# Patient Record
Sex: Male | Born: 1960 | Race: Black or African American | Hispanic: No | Marital: Married | Smoking: Never smoker
Health system: Southern US, Community
[De-identification: ages and names within clinical notes are randomized; demographics above are authoritative.]

## PROBLEM LIST (undated history)

## (undated) DIAGNOSIS — U071 COVID-19: Secondary | ICD-10-CM

## (undated) DIAGNOSIS — I4729 Other ventricular tachycardia: Secondary | ICD-10-CM

## (undated) DIAGNOSIS — M1990 Unspecified osteoarthritis, unspecified site: Secondary | ICD-10-CM

## (undated) DIAGNOSIS — D8685 Sarcoid myocarditis: Secondary | ICD-10-CM

## (undated) DIAGNOSIS — F32A Depression, unspecified: Secondary | ICD-10-CM

## (undated) DIAGNOSIS — I10 Essential (primary) hypertension: Secondary | ICD-10-CM

## (undated) DIAGNOSIS — I509 Heart failure, unspecified: Secondary | ICD-10-CM

## (undated) DIAGNOSIS — I472 Ventricular tachycardia: Secondary | ICD-10-CM

## (undated) HISTORY — PX: OTHER SURGICAL HISTORY: SHX169

## (undated) HISTORY — PX: COLONOSCOPY: SHX174

---

## 2015-09-08 DIAGNOSIS — Z95810 Presence of automatic (implantable) cardiac defibrillator: Secondary | ICD-10-CM

## 2015-09-08 HISTORY — DX: Presence of automatic (implantable) cardiac defibrillator: Z95.810

## 2017-04-30 DIAGNOSIS — I8390 Asymptomatic varicose veins of unspecified lower extremity: Secondary | ICD-10-CM | POA: Insufficient documentation

## 2017-05-03 DIAGNOSIS — I428 Other cardiomyopathies: Secondary | ICD-10-CM | POA: Insufficient documentation

## 2017-07-14 DIAGNOSIS — I5022 Chronic systolic (congestive) heart failure: Secondary | ICD-10-CM | POA: Insufficient documentation

## 2017-10-14 HISTORY — PX: INSERT / REPLACE / REMOVE PACEMAKER: SUR710

## 2017-10-21 DIAGNOSIS — D8685 Sarcoid myocarditis: Secondary | ICD-10-CM | POA: Insufficient documentation

## 2018-02-03 DIAGNOSIS — Z95810 Presence of automatic (implantable) cardiac defibrillator: Secondary | ICD-10-CM | POA: Insufficient documentation

## 2019-08-18 ENCOUNTER — Other Ambulatory Visit: Payer: Self-pay

## 2019-08-18 DIAGNOSIS — Z20822 Contact with and (suspected) exposure to covid-19: Secondary | ICD-10-CM

## 2019-08-19 LAB — NOVEL CORONAVIRUS, NAA: SARS-CoV-2, NAA: NOT DETECTED

## 2019-09-21 ENCOUNTER — Ambulatory Visit: Payer: No Typology Code available for payment source | Attending: Internal Medicine

## 2019-09-21 DIAGNOSIS — Z20822 Contact with and (suspected) exposure to covid-19: Secondary | ICD-10-CM

## 2019-09-22 LAB — NOVEL CORONAVIRUS, NAA: SARS-CoV-2, NAA: NOT DETECTED

## 2019-09-27 ENCOUNTER — Telehealth: Payer: Self-pay | Admitting: General Practice

## 2019-09-27 NOTE — Telephone Encounter (Signed)
Gave patient negative covid test results Patient understood 

## 2019-11-24 ENCOUNTER — Ambulatory Visit: Payer: 59 | Attending: Internal Medicine

## 2019-11-24 DIAGNOSIS — Z23 Encounter for immunization: Secondary | ICD-10-CM

## 2019-11-24 NOTE — Progress Notes (Signed)
   Covid-19 Vaccination Clinic  Name:  Tony Whitney    MRN: [id number] DOB: 1960-12-24  11/24/2019  [name] was observed post Covid-19 immunization for 15 minutes without incident. He was provided with Vaccine Information Sheet and instruction to access the V-Safe system.   [name] was instructed to call 911 with any severe reactions post vaccine: Marland Kitchen Difficulty breathing  . Swelling of face and throat  . A fast heartbeat  . A bad rash all over body  . Dizziness and weakness   Immunizations Administered    Name Date Dose VIS Date Route   Pfizer COVID-19 Vaccine 11/24/2019 10:05 AM 0.3 mL 08/18/2019 Intramuscular   Manufacturer: [employer], [employer]   Lot: [id number]   NDC: [address]-6

## 2019-12-19 ENCOUNTER — Ambulatory Visit: Payer: 59 | Attending: Internal Medicine

## 2019-12-19 DIAGNOSIS — Z23 Encounter for immunization: Secondary | ICD-10-CM

## 2019-12-19 NOTE — Progress Notes (Signed)
   Covid-19 Vaccination Clinic  Name:  Tony Whitney    MRN: [id number] DOB: 04/30/1961  12/19/2019  Tony Whitney was observed post Covid-19 immunization for 15 minutes without incident. He was provided with Vaccine Information Sheet and instruction to access the V-Safe system.   Tony Whitney was instructed to call 911 with any severe reactions post vaccine: Marland Kitchen Difficulty breathing  . Swelling of face and throat  . A fast heartbeat  . A bad rash all over body  . Dizziness and weakness   Immunizations Administered    Name Date Dose VIS Date Route   Pfizer COVID-19 Vaccine 12/19/2019 10:20 AM 0.3 mL 08/18/2019 Intramuscular   Manufacturer: [employer], [employer]   Lot: [id number]   NDC: [address]-2

## 2020-01-28 ENCOUNTER — Emergency Department
Admission: EM | Admit: 2020-01-28 | Discharge: 2020-01-28 | Disposition: A | Discharge status: Home / Self Care | Payer: 59 | Attending: Emergency Medicine | Admitting: Emergency Medicine

## 2020-01-28 ENCOUNTER — Other Ambulatory Visit: Payer: Self-pay

## 2020-01-28 ENCOUNTER — Encounter: Payer: Self-pay | Admitting: Emergency Medicine

## 2020-01-28 DIAGNOSIS — Z950 Presence of cardiac pacemaker: Secondary | ICD-10-CM | POA: Diagnosis not present

## 2020-01-28 DIAGNOSIS — I509 Heart failure, unspecified: Secondary | ICD-10-CM | POA: Diagnosis not present

## 2020-01-28 DIAGNOSIS — R42 Dizziness and giddiness: Secondary | ICD-10-CM | POA: Insufficient documentation

## 2020-01-28 DIAGNOSIS — R55 Syncope and collapse: Secondary | ICD-10-CM

## 2020-01-28 HISTORY — DX: Heart failure, unspecified: I50.9

## 2020-01-28 LAB — URINALYSIS, COMPLETE (UACMP) WITH MICROSCOPIC
Bacteria, UA: NONE SEEN
Bilirubin Urine: NEGATIVE
Glucose, UA: NEGATIVE mg/dL
Hgb urine dipstick: NEGATIVE
Ketones, ur: NEGATIVE mg/dL
Leukocytes,Ua: NEGATIVE
Nitrite: NEGATIVE
Protein, ur: NEGATIVE mg/dL
Specific Gravity, Urine: 1.004 — ABNORMAL LOW (ref 1.005–1.030)
Squamous Epithelial / HPF: NONE SEEN (ref 0–5)
pH: 5 (ref 5.0–8.0)

## 2020-01-28 LAB — BASIC METABOLIC PANEL
Anion gap: 6 (ref 5–15)
BUN: 15 mg/dL (ref 6–20)
CO2: 27 mmol/L (ref 22–32)
Calcium: 9.1 mg/dL (ref 8.9–10.3)
Chloride: 105 mmol/L (ref 98–111)
Creatinine, Ser: 1.33 mg/dL — ABNORMAL HIGH (ref 0.61–1.24)
GFR calc Af Amer: >60 mL/min (ref >60)
GFR calc non Af Amer: 58 mL/min — ABNORMAL LOW (ref >60)
Glucose, Bld: 120 mg/dL — ABNORMAL HIGH (ref 70–99)
Potassium: 4 mmol/L (ref 3.5–5.1)
Sodium: 138 mmol/L (ref 135–145)

## 2020-01-28 LAB — CBC
HCT: 51.6 % (ref 39.0–52.0)
Hemoglobin: 17 g/dL (ref 13.0–17.0)
MCH: 31.4 pg (ref 26.0–34.0)
MCHC: 32.9 g/dL (ref 30.0–36.0)
MCV: 95.4 fL (ref 80.0–100.0)
Platelets: 151 10*3/uL (ref 150–400)
RBC: 5.41 MIL/uL (ref 4.22–5.81)
RDW: 14.2 % (ref 11.5–15.5)
WBC: 5.1 10*3/uL (ref 4.0–10.5)
nRBC: 0 % (ref 0.0–0.2)

## 2020-01-28 LAB — GLUCOSE, CAPILLARY: Glucose-Capillary: 135 mg/dL — ABNORMAL HIGH (ref 70–99)

## 2020-01-28 MED ORDER — SODIUM CHLORIDE 0.9% FLUSH: 3.0000 mL | Freq: Once | INTRAVENOUS | Status: DC

## 2020-01-28 NOTE — ED Triage Notes (Signed)
Pt to ED via POV c/o syncopal episode about 30 minutes PTA. Pt states that he was standing in the doorway saying good-bye to his grandson and next thing he knew he was on the ground. Pt states that he hit his head on the TV. Pt states that he felt fine before episode. Pt reports not feeling well yesterday and says that he laid around the house. Pt denies N/V/D;. Pt states that he has been eating fine and drinking plenty of fluids. Pt denies problems with blood sugar. Pt reports 2 other episodes of syncope in the past but states that work up was negative. Pt is A & O at this time, in NAD.

## 2020-01-28 NOTE — ED Notes (Signed)
[employer] called and will fax over report per representative.

## 2020-01-28 NOTE — ED Provider Notes (Signed)
[facility] Emergency Department Provider Note  ____________________________________________  Time seen: Approximately 6:57 PM  I have reviewed the triage vital signs and the nursing notes.   HISTORY  Chief Complaint Loss of Consciousness    HPI Tony Whitney is a 59 y.o. male with a history of CHF with a pacemaker who comes the ED complaining of passing out episode.  He was in his usual state of health,  but about 1 PM today he suddenly passed out.  Denies any preceding headache vision change paresthesias weakness chest pain shortness of breath abdominal pain or back pain.  After passing out which lasted about a minute, he quickly returned to normal and remained asymptomatic.  Denies any recent palpitations, has been taking all medications as prescribed.  He takes Lasix every other day and today was the Lasix today.     Past Medical History:  Diagnosis Date  . CHF (congestive heart failure) (HCC)      There are no problems to display for this patient.    Past Surgical History:  Procedure Laterality Date  . pacemaker insertion       Prior to Admission medications   Not on File     Allergies Patient has no known allergies.   No family history on file.  Social History Social History   Tobacco Use  . Smoking status: Never Smoker  . Smokeless tobacco: Never Used  Substance Use Topics  . Alcohol use: Not Currently  . Drug use: Not Currently    Review of Systems  Constitutional:   No fever or chills.  ENT:   No sore throat. No rhinorrhea. Cardiovascular:   No chest pain positive syncope. Respiratory:   No dyspnea or cough. Gastrointestinal:   Negative for abdominal pain, vomiting and diarrhea.  Musculoskeletal:   Negative for focal pain or swelling All other systems reviewed and are negative except as documented above in ROS and HPI.  ____________________________________________   PHYSICAL EXAM:  VITAL SIGNS: ED Triage Vitals  [01/28/20 1350]  Enc Vitals Group     BP 97/70     Pulse Rate 70     Resp 16     Temp 98.2 F (36.8 C)     Temp Source Oral     SpO2 96 %     Weight 280 lb (127 kg)     Height 6' (1.829 m)     Head Circumference      Peak Flow      Pain Score 0     Pain Loc      Pain Edu?      Excl. in GC?     Vital signs reviewed, nursing assessments reviewed.   Constitutional:   Alert and oriented. Non-toxic appearance. Eyes:   Conjunctivae are normal. EOMI. PERRL. ENT      Head:   Normocephalic and atraumatic.      Nose:   Wearing a mask.      Mouth/Throat:   Wearing a mask.      Neck:   No meningismus. Full ROM. Hematological/Lymphatic/Immunilogical:   No cervical lymphadenopathy. Cardiovascular:   RRR. Symmetric bilateral radial and DP pulses.  No murmurs. Cap refill less than 2 seconds. Respiratory:   Normal respiratory effort without tachypnea/retractions. Breath sounds are clear and equal bilaterally. No wheezes/rales/rhonchi. Gastrointestinal:   Soft and nontender. Non distended. There is no CVA tenderness.  No rebound, rigidity, or guarding. Musculoskeletal:   Normal range of motion in all extremities. No joint effusions.  No lower extremity tenderness.  No edema. Neurologic:   Normal speech and language.  Motor grossly intact. No acute focal neurologic deficits are appreciated.  Skin:    Skin is warm, dry and intact. No rash noted.  No petechiae, purpura, or bullae.  ____________________________________________    LABS (pertinent positives/negatives) (all labs ordered are listed, but only abnormal results are displayed) Labs Reviewed  BASIC METABOLIC PANEL - Abnormal; Notable for the following components:      Result Value   Glucose, Bld 120 (*)    Creatinine, Ser 1.33 (*)    GFR calc non Af Amer 58 (*)    All other components within normal limits  URINALYSIS, COMPLETE (UACMP) WITH MICROSCOPIC - Abnormal; Notable for the following components:   Color, Urine COLORLESS  (*)    APPearance CLEAR (*)    Specific Gravity, Urine 1.004 (*)    All other components within normal limits  GLUCOSE, CAPILLARY - Abnormal; Notable for the following components:   Glucose-Capillary 135 (*)    All other components within normal limits  CBC  CBG MONITORING, ED   ____________________________________________   EKG  Interpreted by me Sinus rhythm rate of 79, left axis, normal intervals.  Normal QRS ST segments and T waves.  No ischemic changes.  ____________________________________________    RADIOLOGY  No results found.  ____________________________________________   PROCEDURES Procedures  ____________________________________________  DIFFERENTIAL DIAGNOSIS   Dysrhythmia, electrolyte abnormality, dehydration  CLINICAL IMPRESSION / ASSESSMENT AND PLAN / ED COURSE  Medications ordered in the ED: Medications - No data to display  Pertinent labs & imaging results that were available during my care of the patient were reviewed by me and considered in my medical decision making (see chart for details).  Tony Whitney was evaluated in Emergency Department on 01/28/2020 for the symptoms described in the history of present illness. He was evaluated in the context of the global COVID-19 pandemic, which necessitated consideration that the patient might be at risk for infection with the SARS-CoV-2 virus that causes COVID-19. Institutional protocols and algorithms that pertain to the evaluation of patients at risk for COVID-19 are in a state of rapid change based on information released by regulatory bodies including the CDC and federal and state organizations. These policies and algorithms were followed during the patient's care in the ED.   Patient presents with an episode of syncope without any prodromal symptoms, no red flags.  Completely asymptomatic before and after.  It is unusually hot today and today was his day for taking Lasix so I wonder if he was a bit  dehydrated and orthostatic.  He is feeling well, exam is benign, labs are unremarkable.  Pacemaker interrogated, report is showing normal pacemaker function without any dysrhythmia events recently.  Patient is eager to go home, will recommend follow-up with primary care.  Considering the patient's symptoms, medical history, and physical examination today, I have low suspicion for ACS, PE, TAD, pneumothorax, carditis, mediastinitis, pneumonia, CHF, or sepsis.       ____________________________________________   FINAL CLINICAL IMPRESSION(S) / ED DIAGNOSES    Final diagnoses:  Syncope, unspecified syncope type     ED Discharge Orders    None      Portions of this note were generated with dragon dictation software. Dictation errors may occur despite best attempts at proofreading.   Carrie Mew, MD 01/28/20 1904

## 2020-01-28 NOTE — ED Notes (Signed)
Patient and family updated. ICD pacemaker interrogated by this RN.

## 2020-01-28 NOTE — Discharge Instructions (Addendum)
Your lab tests and pacemaker check today were all okay. Please follow up with your doctor for continued monitoring of your symptoms.

## 2020-12-29 DIAGNOSIS — M1712 Unilateral primary osteoarthritis, left knee: Secondary | ICD-10-CM | POA: Insufficient documentation

## 2021-01-31 NOTE — Discharge Instructions (Signed)
Instructions after Total Knee Replacement   Felis Quillin P. Anne Boltz, Jr., M.D.     Dept. of Orthopaedics & Sports Medicine  [facility]  [address]  [place], [place]  [address]  Phone: [phone]   Fax: [phone]    DIET: Drink plenty of non-alcoholic fluids. Resume your normal diet. Include foods high in fiber.  ACTIVITY:  You may use crutches or a walker with weight-bearing as tolerated, unless instructed otherwise. You may be weaned off of the walker or crutches by your Physical Therapist.  Do NOT place pillows under the knee. Anything placed under the knee could limit your ability to straighten the knee.   Continue doing gentle exercises. Exercising will reduce the pain and swelling, increase motion, and prevent muscle weakness.   Please continue to use the TED compression stockings for 6 weeks. You may remove the stockings at night, but should reapply them in the morning. Do not drive or operate any equipment until instructed.  WOUND CARE:  Continue to use the PolarCare or ice packs periodically to reduce pain and swelling. You may bathe or shower after the staples are removed at the first office visit following surgery.  MEDICATIONS: You may resume your regular medications. Please take the pain medication as prescribed on the medication. Do not take pain medication on an empty stomach. You have been given a prescription for a blood thinner (Lovenox or Coumadin). Please take the medication as instructed. (NOTE: After completing a 2 week course of Lovenox, take one Enteric-coated aspirin once a day. This along with elevation will help reduce the possibility of phlebitis in your operated leg.) Do not drive or drink alcoholic beverages when taking pain medications.  CALL THE OFFICE FOR: Temperature above 101 degrees Excessive bleeding or drainage on the dressing. Excessive swelling, coldness, or paleness of the toes. Persistent nausea and vomiting.  FOLLOW-UP:  You  should have an appointment to return to the office in 10-14 days after surgery. Arrangements have been made for continuation of Physical Therapy (either home therapy or outpatient therapy).   [facility] Department Directory         [email or web address]       [email or web address]          Cardiology  Appointments: [place] - [phone] [place] - [phone]  Endocrinology  Appointments: [place] - [phone] [place] - [phone]  Gastroenterology  Appointments: [place] - [phone] [place] - [phone]        General Surgery   Appointments: [place] - [phone]  Internal Medicine/Family Medicine  Appointments: [place] - [phone] [place] - [phone] [place] - [phone]  Metabolic and Weigh Loss Surgery  Appointments: [place] - [phone]        Neurology  Appointments: [place] - [phone] [place] - [phone]  Neurosurgery  Appointments: [place] - [phone]  Obstetrics & Gynecology  Appointments: [place] - [phone] [place] - [phone]        Pediatrics  Appointments: [place] - [phone] [place] - [phone]  Physiatry  Appointments: [place] -[phone]  Physical Therapy  Appointments: [place] - [phone] [place] - [phone]        Podiatry  Appointments: [place] - [phone] [place] - [phone]  Pulmonology  Appointments: [place] - [phone]  Rheumatology  Appointments: [place] - [phone]        [place] Location: [facility]  [address] [place], [place]  [address]  [place] Location: [facility] [address] [place], [place]  [address]  [place] Location: [facility] [address] [place], [place]  [address]    

## 2021-03-03 ENCOUNTER — Other Ambulatory Visit: Payer: Self-pay

## 2021-03-03 ENCOUNTER — Other Ambulatory Visit
Admission: RE | Admit: 2021-03-03 | Discharge: 2021-03-03 | Disposition: A | Discharge status: Home / Self Care | Payer: No Typology Code available for payment source | Source: Ambulatory Visit | Attending: Orthopedic Surgery | Admitting: Orthopedic Surgery

## 2021-03-03 DIAGNOSIS — Z01810 Encounter for preprocedural cardiovascular examination: Secondary | ICD-10-CM | POA: Diagnosis not present

## 2021-03-03 DIAGNOSIS — Z01818 Encounter for other preprocedural examination: Secondary | ICD-10-CM | POA: Diagnosis not present

## 2021-03-03 HISTORY — DX: Essential (primary) hypertension: I10

## 2021-03-03 HISTORY — DX: Unspecified osteoarthritis, unspecified site: M19.90

## 2021-03-03 HISTORY — DX: Depression, unspecified: F32.A

## 2021-03-03 LAB — COMPREHENSIVE METABOLIC PANEL
ALT: 24 U/L (ref 0–44)
AST: 23 U/L (ref 15–41)
Albumin: 3.9 g/dL (ref 3.5–5.0)
Alkaline Phosphatase: 47 U/L (ref 38–126)
Anion gap: 6 (ref 5–15)
BUN: 12 mg/dL (ref 6–20)
CO2: 27 mmol/L (ref 22–32)
Calcium: 9.1 mg/dL (ref 8.9–10.3)
Chloride: 106 mmol/L (ref 98–111)
Creatinine, Ser: 1.43 mg/dL — ABNORMAL HIGH (ref 0.61–1.24)
GFR, Estimated: 56 mL/min — ABNORMAL LOW (ref >60)
Glucose, Bld: 108 mg/dL — ABNORMAL HIGH (ref 70–99)
Potassium: 4 mmol/L (ref 3.5–5.1)
Sodium: 139 mmol/L (ref 135–145)
Total Bilirubin: 1.1 mg/dL (ref 0.3–1.2)
Total Protein: 7.2 g/dL (ref 6.5–8.1)

## 2021-03-03 LAB — TYPE AND SCREEN
ABO/RH(D): O POS
Antibody Screen: NEGATIVE

## 2021-03-03 LAB — C-REACTIVE PROTEIN: CRP: 0.6 mg/dL (ref <1.0)

## 2021-03-03 LAB — CBC
HCT: 51.4 % (ref 39.0–52.0)
Hemoglobin: 17.2 g/dL — ABNORMAL HIGH (ref 13.0–17.0)
MCH: 30.9 pg (ref 26.0–34.0)
MCHC: 33.5 g/dL (ref 30.0–36.0)
MCV: 92.3 fL (ref 80.0–100.0)
Platelets: 144 10*3/uL — ABNORMAL LOW (ref 150–400)
RBC: 5.57 MIL/uL (ref 4.22–5.81)
RDW: 13.9 % (ref 11.5–15.5)
WBC: 3.1 10*3/uL — ABNORMAL LOW (ref 4.0–10.5)
nRBC: 0 % (ref 0.0–0.2)

## 2021-03-03 LAB — URINALYSIS, ROUTINE W REFLEX MICROSCOPIC
Bacteria, UA: NONE SEEN
Bilirubin Urine: NEGATIVE
Glucose, UA: >=500 mg/dL — AB
Hgb urine dipstick: NEGATIVE
Ketones, ur: NEGATIVE mg/dL
Leukocytes,Ua: NEGATIVE
Nitrite: NEGATIVE
Protein, ur: NEGATIVE mg/dL
Specific Gravity, Urine: 1.022 (ref 1.005–1.030)
pH: 5 (ref 5.0–8.0)

## 2021-03-03 LAB — PROTIME-INR
INR: 1.1 (ref 0.8–1.2)
Prothrombin Time: 14.7 seconds (ref 11.4–15.2)

## 2021-03-03 LAB — SURGICAL PCR SCREEN
MRSA, PCR: NEGATIVE
Staphylococcus aureus: NEGATIVE

## 2021-03-03 LAB — APTT: aPTT: 31 seconds (ref 24–36)

## 2021-03-03 LAB — SEDIMENTATION RATE: Sed Rate: 3 mm/hr (ref 0–20)

## 2021-03-03 MED ORDER — CELECOXIB 400 MG PO CAPS
400.0000 mg | ORAL_CAPSULE | Freq: Once | ORAL | Status: DC
  Filled 2021-03-03: qty 1

## 2021-03-03 NOTE — Patient Instructions (Signed)
INSTRUCTIONS FOR SURGERY     Your surgery is scheduled for:  Friday, July 8TH     To find out your arrival time for the day of surgery,          please call [phone] between 1 pm and 3 pm on :  Thursday, July 7TH     When you arrive for surgery, report to the REGISTRATION DESK ON THE FIRST FLOOR OF  THE MEDICAL MALL. ONCE THEY HAVE COMPLETED THEIR WORK, PLEASE GO TO THE SECOND FLOOR AND SIGN IN AT THE SURGERY DESK.    REMEMBER: Instructions that are not followed completely may result in serious medical risk,  up to and including death, or upon the discretion of your surgeon and anesthesiologist,            your surgery may need to be rescheduled.  __X__ 1. Do not eat food after midnight the night before your procedure.                    No gum, candy, lozenger, tic tacs, tums or hard candies.                  ABSOLUTELY NOTHING SOLID IN YOUR MOUTH AFTER MIDNIGHT                    You may drink unlimited clear liquids up to 2 hours before you are scheduled to arrive for surgery.                   Do not drink anything within those 2 hours unless you need to take medicine, then take the                   smallest amount you need.  Clear liquids include:  water, apple juice without pulp,                   any flavor Gatorade, Black coffee, black tea.  Sugar may be added but no dairy/ honey /lemon.                        Broth and jello is not considered a clear liquid.  __x__  2. On the morning of surgery, please brush your teeth with toothpaste and water. You may rinse with                  mouthwash if you wish but DO NOT SWALLOW TOOTHPASTE OR MOUTHWASH  __X___3. NO alcohol for 24 hours before or after surgery.  __x___ 4.  Do NOT smoke or use e-cigarettes for 24 HOURS PRIOR TO SURGERY.                      DO NOT  Use any chewable tobacco products for at least 6 hours prior to surgery.  __x___ 5. If you start any new  medication after this appointment and prior to surgery, please                   Bring it with you on the day of surgery.  ___x__ 6.  Notify your doctor if there is any change in your medical condition, such as fever,                   infection, vomitting, diarrhea or any open sores.  __x___ 7.  USE the CHG SOAP as instructed, the night before surgery and the day of surgery.                   Once you have washed with this soap, do NOT use any of the following: Powders, perfumes                    or lotions. Please do not wear make up, hairpins, clips or nail polish. You MAY wear deodorant.                                                          Men may shave their face and neck.                            DO NOT wear ANY jewelry on the day of surgery. If there are rings that are too tight to                    remove easily, please address this prior to the surgery day. Piercings need to be removed.                                                                     NO METAL ON YOUR BODY.                    Do NOT bring any valuables.  If you came to Pre-Admit testing then you will not need license,                     insurance card or credit card.  If you will be staying overnight, please either leave your things in                     the car or have your family be responsible for these items.                     [facility] IS NOT RESPONSIBLE FOR BELONGINGS OR VALUABLES.  ___X__ 8. DO NOT wear contact lenses on surgery day.  You may not have dentures,                     Hearing aides, contacts or glasses in the operating room. These items can be                    Placed in the Recovery Room to receive immediately after surgery.  ___x__ 10. Take the following medications on the morning of surgery with a sip of water:  1. METOPROLOL                     2. PREDNISONE, if you are still taking                     3.                     4.  __x___ 11.   Follow any instructions provided to you by your surgeon.                        Such as enema, clear liquid bowel prep                   COMPLETE THE PRESURGICAL CARBOHYDRATE DRINK ON THE                     MORNING OF SURGERY.  FINISH IT BY THE TIME YOU LEAVE YOUR                      HOUSE.   __X__  12. STOP ALL ASPIRIN PRODUCTS AS OF ONE WEEK PRIOR TO SURGERY                       THIS INCLUDES BC POWDERS / GOODIES POWDER  __x___ 13. STOP Anti-inflammatories as of ONE WEEK PRIOR TO SURGERY                      This includes IBUPROFEN / MOTRIN / ADVIL / ALEVE/ NAPROXYN                    YOU MAY TAKE TYLENOL ANY TIME PRIOR TO SURGERY.  ____X_ 14.  Stop supplements until after surgery.                     This includes:  FOLIC ACID   ______17.  Continue to take the following medications but do not take on the morning of surgery:                         LASIX // ENTRESTO (both dosages) // ALDACTONE  ____X__18. If staying overnight, please have appropriate shoes to wear to be able to walk around the unit.                   Wear clean and comfortable clothing to the hospital.  BRING CELL PHONE AND CHARGER.  HAVE STOOL SOFTENERS TO TAKE ONCE HOME.  I SUGGEST YOU START TAKING THEM A     COUPLE DAYS PRIOR TO SURGERY.  IF YOU COMPLETE THE [facility] [employer] [employer] BOOKLET, PLEASE BRING WITH YOU   SO WE CAN MAKE A COPY OF IT FOR YOUR CHART.

## 2021-03-04 ENCOUNTER — Encounter: Payer: Self-pay | Admitting: Orthopedic Surgery

## 2021-03-04 LAB — URINE CULTURE
Culture: NO GROWTH
Special Requests: NORMAL

## 2021-03-04 LAB — HEMOGLOBIN A1C
Hgb A1c MFr Bld: 5.6 % (ref 4.8–5.6)
Mean Plasma Glucose: 114 mg/dL

## 2021-03-04 NOTE — Progress Notes (Signed)
Perioperative Services  Pre-Admission/Anesthesia Testing Clinical Review  Date: 03/13/21  Patient Demographics:  Name: Tony Whitney DOB:   1961/04/11 MRN:   [id number]  Planned Surgical Procedure(s):    Case: [id number] Date/Time: 03/14/21 0700   Procedure: COMPUTER ASSISTED TOTAL KNEE ARTHROPLASTY - RNFA (Left: Knee)   Anesthesia type: Choice   Pre-op diagnosis: PRIMARY OSTEOARTHRITIS OF LEFT KNEE.   Location: [facility] OR ROOM 01 / [facility]S FOR ANESTHESIA [facility]   Surgeons: Tony Heinz, Whitney     NOTE: Available PAT nursing documentation and vital signs have been reviewed. Clinical nursing staff has updated patient's PMH/PSHx, current medication list, and drug allergies/intolerances to ensure comprehensive history available to assist in medical decision making as it pertains to the aforementioned surgical procedure and anticipated anesthetic course. Extensive review of available clinical information performed. Tony Whitney PMH and PSHx updated with any diagnoses/procedures that  may have been inadvertently omitted during his intake with the pre-admission testing department's nursing staff.  Clinical Discussion:  Tony Whitney is a 60 y.o. male who is submitted for pre-surgical anesthesia review and clearance prior to him undergoing the above procedure. Patient has never been a smoker. Pertinent PMH includes: cardiac sarcoidosis, dilated cardiomyopathy, CHF, NSVT, HTN, OA  Patient is followed by cardiology Tony Blow, Whitney). He was last seen in the cardiology clinic on 07/30/2020; notes reviewed.  At the time of his clinic visit, patient reported "quite well".  Patient denied any episodes of chest pain, shortness breath, PND, orthopnea, palpitations, significant peripheral edema, vertiginous symptoms, or presyncope/syncope.  Patient did report an episode a few weeks prior to being seen where he felt like he was "more bloated retaining fluid".  Patient use prescribed diuretic daily x 1 week and symptoms  resolved patient with concerns that activity level was not at the level that he would like, but noted that his exercise tolerance was much better than it had been in the past. Patient has a PMH significant for cardiovascular diagnoses.  TTE performed on 04/30/2017 revealed severe left ventricular systolic dysfunction (LVEF <15%), mild LVH, moderate right ventricular systolic dysfunction, biatrial enlargement, and mild valvular insufficiency.  Diagnostic left heart catheterization performed on 05/03/2017 demonstrated no significant CAD.  LVEDP elevated at 35-30 mmHg.  Cardiac MRI performed on 05/18/2017 demonstrated a severely depressed left ventricular systolic function; LVEF calculated at 17%.  Imaging noted a thickened lateral wall that was akinetic, in addition to severe hypokinesis of the remaining myocardial walls.  Right ventricle moderately dilated with normal thickness and severely depressed systolic function; RVEF 25%.  There was moderate biatrial enlargement.  Delayed enhancement imaging abnormal.  There was diffuse scar throughout the left ventricular myocardium and parts of the right ventricular myocardium.  Pattern felt to be nonspecific but may represent chronic severe myocarditis and an infiltrative disorder such as myocardial sarcoidosis.  Long-term cardiac event monitor study performed on 09/12/2017 demonstrated a predominantly underlying sinus rhythm with average heart rate of 71 bpm.  Minimum heart rate was 50 and maximum rate was 114 bpm.  There were rare PVCs (approximately 500) and one 8 beat run of NSVT.  Myocardial PET scan performed on 09/17/2017 revealed abnormal FDG uptake consistent with active inflammation of the myocardium/active sarcoid.  Repeat studies on 05/03/2019 and 12/12/2019 demonstrate no active inflammation.  Patient underwent implantation of a dual-chamber Medtronic AICD on 10/14/2017.  Blood pressure well controlled 121/81 on prescribed diuretic and  beta-blocker therapies.  Patient on a statin for his HLD.  Patient on chronic prednisone and MTX  for his cardiac sarcoidosis diagnosis. Functional capacity, as defined by DASI, is documented as being >/= 4 METS.  No changes were made to patient's medication regimen.  Patient to follow-up with outpatient cardiology in 9 months or sooner if needed.  Patient is scheduled for an elective total knee arthroplasty on 03/14/2021 with Dr. Francesco SorJames Whitney.  Given patient's past medical history significant for cardiovascular diagnoses, presurgical cardiac clearance was sought by the PAT team.  Per cardiology, "Tony Whitney has a cardiomyopathy with a reduced left ventricular ejection fraction related to sarcoidosis. However, he has responded to immunosuppression and medical treatment for his heart failure. His cardiac condition poses some risk for the surgery, but I am optimistic that this can be managed as his heart failure has been stable for some time, and his overall functional status is quite good. He will need to remain on immunosuppression around the time of surgery and this may increase his risk of a peri-operative infection.  Patient is optimized for surgery at an overall LOW risk for significant perioperative cardiovascular complications". This patient is not currently taking any type of anticoagulation/antiplatelet therapies that will need to be held preoperatively.  Patient denies previous perioperative complications with anesthesia in the past. In review of the EMR, there are no available records for review regarding patient's past surgical/anesthetic courses within the [facility] system.  Vitals with BMI 03/03/2021 01/28/2020 01/28/2020  Height 6\' 0"  - -  Weight 296 lbs 2 oz - -  BMI 40.15 - -  Systolic 103 - 103  Diastolic 78 - 80  Pulse 69 57 61    Providers/Specialists:   NOTE: Primary physician provider listed below. Patient may have been seen by APP or partner within same practice.   PROVIDER  ROLE / SPECIALTY LAST OV  Tony Whitney  Orthopedics (Surgeon) 12/26/2020  Tony Whitney  Primary Care Provider ???  Sherre Scarletevore, Tony D, Whitney  Cardiology 07/10/2020   Allergies:  Patient has no known allergies.  Current Home Medications:   No current facility-administered medications for this encounter.    folic acid (FOLVITE) 1 MG tablet   furosemide (LASIX) 20 MG tablet   methotrexate (RHEUMATREX) 2.5 MG tablet   metoprolol succinate (TOPROL-XL) 25 MG 24 hr tablet   predniSONE (DELTASONE) 5 MG tablet   sacubitril-valsartan (ENTRESTO) 24-26 MG   sacubitril-valsartan (ENTRESTO) 49-51 MG   spironolactone (ALDACTONE) 25 MG tablet   History:   Past Medical History:  Diagnosis Date   AICD (automatic cardioverter/defibrillator) present 2017   d/t chronic heart failure   Arthritis    Cardiac sarcoidosis    chronically takes prednisone and methotrexate   CHF (congestive heart failure) (HCC)    Depression    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 Social History   Tobacco Use   Smoking status: Never   Smokeless tobacco: Never  Vaping Use   Vaping Use: Never used  Substance Use Topics   Alcohol use: Not Currently   Drug use: Never    Pertinent Clinical Results:  LABS: Labs reviewed: Acceptable for surgery.  Hospital Outpatient Visit on 03/03/2021  Component Date Value Ref Range Status   CRP 03/03/2021 0.6  <1.0 mg/dL Final   Performed at [facility], [address] [place], [place] [address]   Hgb A1c MFr Bld 03/03/2021 5.6  4.8 - 5.6 % Final   Comment: (NOTE)         Prediabetes: 5.7 - 6.4         Diabetes: >6.4         Glycemic control for adults with diabetes: <7.0    Mean Plasma Glucose 03/03/2021 114  mg/dL Final    Comment: (NOTE) Performed At: [facility] [address] [place], [place] [id number] Jolene Schimke Whitney HT:[id number]    MRSA, PCR 03/03/2021 NEGATIVE  NEGATIVE Final   Staphylococcus aureus 03/03/2021 NEGATIVE  NEGATIVE Final   Comment: (NOTE) The Xpert SA Assay (FDA approved for NASAL specimens in patients 49 years of age and older), is one component of a comprehensive surveillance program. It is not intended to diagnose infection nor to guide or monitor treatment. Performed at [facility], [address] [place], [place] [address]    Sed Rate 03/03/2021 3  0 - 20 mm/hr Final   Performed at [facility], [address] [place], [place] [address]   WBC 03/03/2021 3.1 (A) 4.0 - 10.5 K/uL Final   RBC 03/03/2021 5.57  4.22 - 5.81 MIL/uL Final   Hemoglobin 03/03/2021 17.2 (A) 13.0 - 17.0 g/dL Final   HCT 86/16/8372 51.4  39.0 - 52.0 % Final   MCV 03/03/2021 92.3  80.0 - 100.0 fL Final   MCH 03/03/2021 30.9  26.0 - 34.0 pg Final   MCHC 03/03/2021 33.5  30.0 - 36.0 g/dL Final   RDW 90/21/1155 13.9  11.5 - 15.5 % Final   Platelets 03/03/2021 144 (A) 150 - 400 K/uL Final   nRBC 03/03/2021 0.0  0.0 - 0.2 % Final   Performed at [facility], [address] [place], [place] [address]   Sodium 03/03/2021 139  135 - 145 mmol/L Final   Potassium 03/03/2021 4.0  3.5 - 5.1 mmol/L Final   Chloride 03/03/2021 106  98 - 111 mmol/L Final   CO2 03/03/2021 27  22 - 32 mmol/L Final   Glucose, Bld 03/03/2021 108 (A) 70 - 99 mg/dL Final   Glucose reference range applies only to samples taken after fasting for at least 8 hours.   BUN 03/03/2021 12  6 - 20 mg/dL Final   Creatinine, Ser 03/03/2021 1.43 (A) 0.61 - 1.24 mg/dL Final   Calcium 23/36/1224 9.1  8.9 - 10.3 mg/dL Final   Total Protein 49/75/3005 7.2  6.5 - 8.1 g/dL Final   Albumin 07/09/1116 3.9  3.5 - 5.0 g/dL Final   AST 35/67/0141 23  15 - 41 U/L Final   ALT 03/03/2021 24  0 - 44 U/L Final    Alkaline Phosphatase 03/03/2021 47  38 - 126 U/L Final   Total Bilirubin 03/03/2021 1.1  0.3 - 1.2 mg/dL Final   GFR, Estimated 03/03/2021 56 (A) >60 mL/min Final   Comment: (NOTE) Calculated using the CKD-EPI Creatinine Equation (2021)    Anion gap 03/03/2021 6  5 - 15 Final   Performed at [facility], [address] [place], [place] [address]   Prothrombin Time 03/03/2021 14.7  11.4 - 15.2 seconds Final   INR 03/03/2021 1.1  0.8 - 1.2 Final   Comment: (NOTE) INR goal varies based on device and disease states. Performed at [facility], [address], [place], [place] [address]    aPTT 03/03/2021 31  24 -  36 seconds Final   Performed at [facility], [address] [place], [place] [address]   Color, Urine 03/03/2021 YELLOW (A) YELLOW Final   APPearance 03/03/2021 CLEAR (A) CLEAR Final   Specific Gravity, Urine 03/03/2021 1.022  1.005 - 1.030 Final   pH 03/03/2021 5.0  5.0 - 8.0 Final   Glucose, UA 03/03/2021 >=500 (A) NEGATIVE mg/dL Final   Hgb urine dipstick 03/03/2021 NEGATIVE  NEGATIVE Final   Bilirubin Urine 03/03/2021 NEGATIVE  NEGATIVE Final   Ketones, ur 03/03/2021 NEGATIVE  NEGATIVE mg/dL Final   Protein, ur 50/53/9767 NEGATIVE  NEGATIVE mg/dL Final   Nitrite 34/19/3790 NEGATIVE  NEGATIVE Final   Leukocytes,Ua 03/03/2021 NEGATIVE  NEGATIVE Final   RBC / HPF 03/03/2021 0-5  0 - 5 RBC/hpf Final   WBC, UA 03/03/2021 0-5  0 - 5 WBC/hpf Final   Bacteria, UA 03/03/2021 NONE SEEN  NONE SEEN Final   Squamous Epithelial / LPF 03/03/2021 0-5  0 - 5 Final   Mucus 03/03/2021 PRESENT   Final   Performed at [facility], [address] [place], [place] [address]   Specimen Description 03/03/2021    Final                   Value:URINE, RANDOM Performed at [facility], [address] [place], [place] [address]    Special Requests 03/03/2021    Final                   Value:Normal Performed at [facility], [address], [place], [place] [address]    Culture 03/03/2021    Final                   Value:NO GROWTH Performed at [facility], [address], [place], [place] [address]    Report Status 03/03/2021 03/04/2021 FINAL   Final   ABO/RH(D) 03/03/2021 O POS   Final   Antibody Screen 03/03/2021 NEG   Final   Sample Expiration 03/03/2021 03/17/2021,2359   Final   Extend sample reason 03/03/2021    Final                   Value:NO TRANSFUSIONS OR PREGNANCY IN THE PAST 3 MONTHS Performed at [facility], [address] [place], [place] [address]     ECG: Date: 03/03/2021 Time ECG obtained: 1048 AM Rate: 65 bpm Rhythm:  Atrial paced rhythm Axis (leads I and aVF): Normal Intervals: PR 194 ms. QRS 104 ms. QTc 430 ms. ST segment and T wave changes: Inferior nonspecific T wave abnormality; T wave inversion in lateral leads Comparison: Similar to previous tracing obtained on 11/29/2019 No previous tracings available for review and comparison.   IMAGING / PROCEDURES: PET MYOCARDIAL WITH CONCURRENT CT performed on 12/12/2019 No FDG uptake within the myocardium to indicate active inflammation Nonspecific perfusion defects likely representing chronic sarcoidosis/scarring Previously noted extensive hypermetabolism no longer identified In the chest, there has been near resolution of previous FDG avid right lower lobe opacities with small ill-defined opacity remaining with SUV max of 3.7 (previously SUV max 11). Several new FDG avid opacities in the left lower lobe, likely inflammatory or infectious No metabolically active mediastinal or axillary lymph nodes No FDG avid osseous lesions Left chest wall pacemaker Probable right renal cyst again noted Visualized upper abdominal viscera unremarkable No evidence of active myocardial inflammation  ECHOCARDIOGRAM WITH BUBBLE STUDY performed on 12/11/2019 LVEF 25% Severe left  ventricular dysfunction Elevated left atrial pressures  with diastolic dysfunction Mild right ventricular systolic dysfunction Trivial MR and TR No AR or PR No valvular stenosis Saline microcavitation showed borderline late atrial shunting after Valsalva; positive saline contrast study  LONG TERM CARDIAC EVENT MONITOR STUDY performed in 09/12/2017 Predominantly underlying sinus rhythm with average heart rate of 71 bpm Minimum heart rate was 50 bpm Maximum sinus heart rate was 114 bpm Rare PVCs (approximately 500) with one 8 beat run of NSVT No significant pauses greater than 2 seconds or significant AV block  CARDIAC MRI performed on 05/18/2017 The left ventricle is severely dilated and spherical in appearance. There is severely depressed systolic function with a calculated ejection fraction of 17%. The lateral wall is thinned out and akinetic. The rest of the walls are severely hypokinetic.  The right ventricle is moderately dilated with normal thickness and with severely depressed systolic function. RVEF is 25%.  The atria are moderately enlarged.  The aortic valve is trileaflet in morphology. There is no significant aortic valve stenosis or regurgitation. There is mild tricuspid and mild-moderate mitral valve regurgitation.  Delayed enhancement imaging is abnormal. There is diffuse scar throughout the LV myocardium and parts of the RV myocardium. The scar is in the midwall in the septum and is nearly transmural in the lateral wall. The pattern is nonspecific but may represent chronic severe myocarditis and an infiltrative disorder such as myocardial sarcoidosis cannot be ruled out.  No intracardiac thrombus are visualized. There is a small pericardial effusion  LEFT HEART CATHETERIZATION AND CORONARY ANGIOGRAPHY performed on 05/03/2017 No significant CAD LVEDP elevated at 30-35 mmHg  TRANSTHORACIC ECHOCARDIOGRAM performed on 04/30/2017 LVEF <15% Severe left ventricular systolic dysfunction with mild LVH Moderate right ventricular systolic  dysfunction Mild MR Trivial TR and PR No AR No evidence of valvular stenosis Trivial pericardial effusion Biatrial enlargement Severe diastolic dysfunction No previous studies for comparison  Impression and Plan:  Zahi Plaskett has been referred for pre-anesthesia review and clearance prior to him undergoing the planned anesthetic and procedural courses. Available labs, pertinent testing, and imaging results were personally reviewed by me. This patient has been appropriately cleared by cardiology with an overall LOW risk of significant perioperative cardiovascular complications. Completed perioperative prescription for cardiac device management documentation completed by primary cardiology team and placed on patient's chart for review by the surgical/anesthetic team on the day of his procedure.   Based on clinical review performed today (03/12/2021), barring any significant acute changes in the patient's overall condition, it is anticipated that he will be able to proceed with the planned surgical intervention. Any acute changes in clinical condition may necessitate his procedure being postponed and/or cancelled. Patient will meet with anesthesia team (Whitney and/or CRNA) on the day of his procedure for preoperative evaluation/assessment. Questions regarding anesthetic course will be fielded at that time.   Pre-surgical instructions were reviewed with the patient during his PAT appointment and questions were fielded by PAT clinical staff. Patient was advised that if any questions or concerns arise prior to his procedure then he should return a call to PAT and/or his surgeon's office to discuss.  Quentin Mulling, MSN, APRN, FNP-C, CEN [facility]  Peri-operative Services Nurse Practitioner Phone: [phone] Fax: [phone] 03/12/2021 8:48 AM  NOTE: This note has been prepared using Dragon dictation software. Despite my best ability to proofread, there is always the potential  that unintentional transcriptional errors may still occur from this process.

## 2021-03-12 ENCOUNTER — Other Ambulatory Visit: Payer: Self-pay

## 2021-03-12 ENCOUNTER — Other Ambulatory Visit
Admission: RE | Admit: 2021-03-12 | Discharge: 2021-03-12 | Disposition: A | Discharge status: Home / Self Care | Payer: No Typology Code available for payment source | Source: Ambulatory Visit | Attending: Orthopedic Surgery | Admitting: Orthopedic Surgery

## 2021-03-12 DIAGNOSIS — Z01812 Encounter for preprocedural laboratory examination: Secondary | ICD-10-CM | POA: Insufficient documentation

## 2021-03-12 DIAGNOSIS — U071 COVID-19: Secondary | ICD-10-CM | POA: Insufficient documentation

## 2021-03-12 LAB — SARS CORONAVIRUS 2 (TAT 6-24 HRS): SARS Coronavirus 2: POSITIVE — AB

## 2021-05-22 ENCOUNTER — Other Ambulatory Visit: Payer: Self-pay

## 2021-05-22 ENCOUNTER — Encounter
Admission: RE | Admit: 2021-05-22 | Discharge: 2021-05-22 | Disposition: A | Discharge status: Home / Self Care | Payer: No Typology Code available for payment source | Source: Ambulatory Visit | Attending: Orthopedic Surgery | Admitting: Orthopedic Surgery

## 2021-05-22 DIAGNOSIS — Z01812 Encounter for preprocedural laboratory examination: Secondary | ICD-10-CM | POA: Diagnosis not present

## 2021-05-22 DIAGNOSIS — R300 Dysuria: Secondary | ICD-10-CM | POA: Diagnosis not present

## 2021-05-22 HISTORY — DX: COVID-19: U07.1

## 2021-05-22 LAB — COMPREHENSIVE METABOLIC PANEL
ALT: 23 U/L (ref 0–44)
AST: 24 U/L (ref 15–41)
Albumin: 3.9 g/dL (ref 3.5–5.0)
Alkaline Phosphatase: 41 U/L (ref 38–126)
Anion gap: 6 (ref 5–15)
BUN: 18 mg/dL (ref 6–20)
CO2: 25 mmol/L (ref 22–32)
Calcium: 9.3 mg/dL (ref 8.9–10.3)
Chloride: 106 mmol/L (ref 98–111)
Creatinine, Ser: 1.33 mg/dL — ABNORMAL HIGH (ref 0.61–1.24)
GFR, Estimated: >60 mL/min (ref >60)
Glucose, Bld: 111 mg/dL — ABNORMAL HIGH (ref 70–99)
Potassium: 3.9 mmol/L (ref 3.5–5.1)
Sodium: 137 mmol/L (ref 135–145)
Total Bilirubin: 1.2 mg/dL (ref 0.3–1.2)
Total Protein: 7.1 g/dL (ref 6.5–8.1)

## 2021-05-22 LAB — CBC
HCT: 49.5 % (ref 39.0–52.0)
Hemoglobin: 17.1 g/dL — ABNORMAL HIGH (ref 13.0–17.0)
MCH: 31.8 pg (ref 26.0–34.0)
MCHC: 34.5 g/dL (ref 30.0–36.0)
MCV: 92 fL (ref 80.0–100.0)
Platelets: 166 10*3/uL (ref 150–400)
RBC: 5.38 MIL/uL (ref 4.22–5.81)
RDW: 14.1 % (ref 11.5–15.5)
WBC: 4.4 10*3/uL (ref 4.0–10.5)
nRBC: 0 % (ref 0.0–0.2)

## 2021-05-22 LAB — URINALYSIS, COMPLETE (UACMP) WITH MICROSCOPIC
Bacteria, UA: NONE SEEN
Bilirubin Urine: NEGATIVE
Glucose, UA: >=500 mg/dL — AB
Hgb urine dipstick: NEGATIVE
Ketones, ur: NEGATIVE mg/dL
Leukocytes,Ua: NEGATIVE
Nitrite: NEGATIVE
Protein, ur: NEGATIVE mg/dL
Specific Gravity, Urine: 1.023 (ref 1.005–1.030)
Squamous Epithelial / HPF: NONE SEEN (ref 0–5)
pH: 5 (ref 5.0–8.0)

## 2021-05-22 LAB — PROTIME-INR
INR: 1.1 (ref 0.8–1.2)
Prothrombin Time: 14.5 seconds (ref 11.4–15.2)

## 2021-05-22 LAB — TYPE AND SCREEN
ABO/RH(D): O POS
Antibody Screen: NEGATIVE

## 2021-05-22 LAB — C-REACTIVE PROTEIN: CRP: 0.6 mg/dL (ref <1.0)

## 2021-05-22 LAB — SEDIMENTATION RATE: Sed Rate: 2 mm/hr (ref 0–20)

## 2021-05-22 LAB — SURGICAL PCR SCREEN
MRSA, PCR: NEGATIVE
Staphylococcus aureus: NEGATIVE

## 2021-05-22 LAB — APTT: aPTT: 30 seconds (ref 24–36)

## 2021-05-22 NOTE — Patient Instructions (Signed)
Your procedure is scheduled on: 06/02/21  Report to the Registration Desk on the 1st floor of the Medical Mall. To find out your arrival time, please call [phone] between 1PM - 3PM on: 05/30/21 - Friday  REMEMBER: Instructions that are not followed completely may result in serious medical risk, up to and including death; or upon the discretion of your surgeon and anesthesiologist your surgery may need to be rescheduled.  Do not eat food after midnight the night before surgery.  No gum chewing, lozengers or hard candies.  You may however, drink CLEAR liquids up to 2 hours before you are scheduled to arrive for your surgery. Do not drink anything within 2 hours of your scheduled arrival time.  Clear liquids include: - water  - apple juice without pulp - gatorade (not RED, PURPLE, OR BLUE) - black coffee or tea (Do NOT add milk or creamers to the coffee or tea) Do NOT drink anything that is not on this list.  In addition, your doctor has ordered for you to drink the provided  Ensure Pre-Surgery Clear Carbohydrate Drink  Drinking this carbohydrate drink up to two hours before surgery helps to reduce insulin resistance and improve patient outcomes. Please complete drinking 2 hours prior to scheduled arrival time.  TAKE THESE MEDICATIONS THE MORNING OF SURGERY WITH A SIP OF WATER: - metoprolol succinate (TOPROL-XL) 25 MG 24 hr tablet - predniSONE (DELTASONE) 5 MG tablet  One week prior to surgery: Stop Anti-inflammatories (NSAIDS) such as Advil, Aleve, Ibuprofen, Motrin, Naproxen, Naprosyn and Aspirin based products such as Excedrin, Goodys Powder, BC Powder.  Stop ANY OVER THE COUNTER supplements until after surgery.  You may however, continue to take Tylenol if needed for pain up until the day of surgery.  No Alcohol for 24 hours before or after surgery.  No Smoking including e-cigarettes for 24 hours prior to surgery.  No chewable tobacco products for at least 6 hours prior  to surgery.  No nicotine patches on the day of surgery.  Do not use any "recreational" drugs for at least a week prior to your surgery.  Please be advised that the combination of cocaine and anesthesia may have negative outcomes, up to and including death. If you test positive for cocaine, your surgery will be cancelled.  On the morning of surgery brush your teeth with toothpaste and water, you may rinse your mouth with mouthwash if you wish. Do not swallow any toothpaste or mouthwash.  Use CHG Soap or wipes as directed on instruction sheet.  Do not wear jewelry, make-up, hairpins, clips or nail polish.  Do not wear lotions, powders, or perfumes.   Do not shave body from the neck down 48 hours prior to surgery just in case you cut yourself which could leave a site for infection.  Also, freshly shaved skin may become irritated if using the CHG soap.  Contact lenses, hearing aids and dentures may not be worn into surgery.  Do not bring valuables to the hospital. [facility] is not responsible for any missing/lost belongings or valuables.   Notify your doctor if there is any change in your medical condition (cold, fever, infection).  Wear comfortable clothing (specific to your surgery type) to the hospital.  After surgery, you can help prevent lung complications by doing breathing exercises.  Take deep breaths and cough every 1-2 hours. Your doctor may order a device called an Incentive Spirometer to help you take deep breaths. When coughing or sneezing, hold a pillow  firmly against your incision with both hands. This is called "splinting." Doing this helps protect your incision. It also decreases belly discomfort.  If you are being admitted to the hospital overnight, leave your suitcase in the car. After surgery it may be brought to your room.  If you are being discharged the day of surgery, you will not be allowed to drive home. You will need a responsible adult (18 years or older)  to drive you home and stay with you that night.   If you are taking public transportation, you will need to have a responsible adult (18 years or older) with you. Please confirm with your physician that it is acceptable to use public transportation.   Please call the Pre-admissions Testing Dept. at [phone] if you have any questions about these instructions.  Surgery Visitation Policy:  Patients undergoing a surgery or procedure may have one family member or support person with them as long as that person is not COVID-19 positive or experiencing its symptoms.  That person may remain in the waiting area during the procedure.  Inpatient Visitation:    Visiting hours are 7 a.m. to 8 p.m. Inpatients will be allowed two visitors daily. The visitors may change each day during the patient's stay. No visitors under the age of 55. Any visitor under the age of 28 must be accompanied by an adult. The visitor must pass COVID-19 screenings, use hand sanitizer when entering and exiting the patient's room and wear a mask at all times, including in the patient's room. Patients must also wear a mask when staff or their visitor are in the room. Masking is required regardless of vaccination status.

## 2021-05-23 LAB — URINE CULTURE: Culture: NO GROWTH

## 2021-05-31 NOTE — Anesthesia Preprocedure Evaluation (Addendum)
Anesthesia Evaluation  Patient identified by MRN, date of birth, ID band Patient awake    Reviewed: Allergy & Precautions, NPO status , Patient's Chart, lab work & pertinent test results  Airway Mallampati: III  TM Distance: >3 FB Neck ROM: Full    Dental  (+) Missing,    Pulmonary neg pulmonary ROS,    Pulmonary exam normal        Cardiovascular Exercise Tolerance: Poor METS: 3 - Mets hypertension, + CAD (nonobstructing) and +CHF  (-) DOE negative cardio ROS Normal cardiovascular exam+ dysrhythmias (NSVT (nonsustained ventricular tachycardia) ) + Cardiac Defibrillator (placed 2019)  Rhythm:Regular Rate:Normal - Systolic murmurs and - Peripheral Edema NICM, EF 28%, non-obstructive CAD by cath in 2018, S/P ICD, diagnosed with sarcoidosis,  started on Methotrexate and Prednisone in 2019, has since discontinued Prednisone   Cardiac sarcoidosis  Device Interrogation 04/04/2021:  Stable dual chamber ICD battery and lead measurements. <1% RV Paced. No therapies delivered. 5 VT-NS episodes. Longest- 1 second. EGMs suggest non-sustained VT.   NYHA class II   Neuro/Psych PSYCHIATRIC DISORDERS Depression negative neurological ROS  negative psych ROS   GI/Hepatic negative GI ROS, Neg liver ROS,   Endo/Other  negative endocrine ROS  Renal/GU negative Renal ROS  negative genitourinary   Musculoskeletal negative musculoskeletal ROS (+) Arthritis , Osteoarthritis,    Abdominal (+) + obese,   Peds negative pediatric ROS (+)  Hematology negative hematology ROS (+)   Anesthesia Other Findings PET Myocardial Perfusion 05/2021: Metabolism:  Cardiac PET/CT metabolic study with F-18 FDG 14.6 mCi injected IV andscanned on PET Discovery MI. There are several  segments with hypermetabolic activity ranging from mild-severe suggesting an underlying active inflammatory process in the  LV myocardium. This is a change from the last  FDG cardiac PET performed in 12/2019 which demonstrated no evidence of  hypermetabolic activity. There is no evidence of abnormal metabolism in the RV.   Perfusion/Function:  Gated cardiac PET/CT rest myocardial perfusion study with [id number] demonstrates:  1. Myocardial perfusion defects are present on rest images. Depending upon whether or not there is concomitant    hypermetabolic acitivty in each LV segment with a perfusion abnormality, some of these segments are consistent with    active inflammation while others are scar.  2. Severely reduced left ventricular systolic function with increased left ventricular volumes.   Cardiac CT Viewer:  Coronary artery calcifications are present. Dual-chamber ICD noted.     ECHO 12/2019: SEVERE LV DYSFUNCTION (See above)  ELEVATED LA PRESSURES WITH DIASTOLIC DYSFUNCTION  MILD RV SYSTOLIC DYSFUNCTION (See above)  VALVULAR REGURGITATION: TRIVIAL MR, TRIVIAL TR  NO VALVULAR STENOSIS  SALINE MICROCAVITATIONS SHOW BORDERLINE LATE ATRIAL SHUNTING AFTER  VALSALVA.  POSITIVE SALINE CONTRAST STUDY  3D acquisition and reconstructions were performed as part of this  examination to more accurately quantify the effects of reduced left  ventricular ejection fraction. (post-processing on an Independent  workstation).    Reproductive/Obstetrics negative OB ROS                         Anesthesia Physical Anesthesia Plan  ASA: 4  Anesthesia Plan: General   Post-op Pain Management:    Induction:   PONV Risk Score and Plan: 1 and 2 and Ondansetron, Dexamethasone and Midazolam  Airway Management Planned: Oral ETT  Additional Equipment: Arterial line  Intra-op Plan:   Post-operative Plan: Extubation in OR  Informed Consent: I have reviewed the patients History and Physical, chart, labs and  discussed the procedure including the risks, benefits and alternatives for the proposed anesthesia with  the patient or authorized representative who has indicated his/her understanding and acceptance.     Dental advisory given  Plan Discussed with: Anesthesiologist, CRNA and Surgeon  Anesthesia Plan Comments:       Anesthesia Quick Evaluation

## 2021-06-01 NOTE — H&P (Signed)
ORTHOPAEDIC HISTORY & PHYSICAL Michelene Gardener, [place] - 05/27/2021 8:45 AM EDT Formatting of this note is different from the original. [facility] Chief Complaint:   Chief Complaint  Patient presents with   Knee Pain  H & P LEFT KNEE   History of Present Illness:   Tony Whitney is a 60 y.o. male that presents to clinic today for his preoperative history and evaluation. Patient presents unaccompanied. The patient is scheduled to undergo a left total knee arthroplasty on 06/02/21 by Dr. Ernest Pine. His pain began several years ago. The pain is located primarily along the medial aspect of the knee. He describes his pain as worse with walking and stairs. He reports associated giving way of the knee. He denies associated numbness or tingling, locking, or swelling.    The patient's symptoms have progressed to the point that they decrease his quality of life. The patient has previously undergone conservative treatment including NSAIDS and injections to the knee without adequate control of his symptoms.  Patient sees Dr Edwena Blow for heart failure. He has received cardiac clearance. Denies history of lumbar surgery, blood clots.   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  LVEF 25% by PET 6/19   Idiopathic cardiomyopathy (CMS-HCC)   NSVT (nonsustained ventricular tachycardia) (CMS-HCC)   Past Surgical History:  Past Surgical History:  Procedure Laterality Date   icd   KNEE ARTHROSCOPY   RECONSTRUCTION WRIST   Current Medications:  Current Outpatient Medications  Medication Sig Dispense Refill   empagliflozin (JARDIANCE) 10 mg tablet Take 1 tablet (10 mg total) by mouth daily with breakfast 90 tablet 3   escitalopram oxalate (LEXAPRO)  20 MG tablet Take 20 mg by mouth once daily   folic acid (FOLVITE) 1 MG tablet Take 1 tablet (1 mg total) by mouth once daily 90 tablet 3   FUROsemide (LASIX) 20 MG tablet Take 1 tablet (20 mg total) by mouth every other day (Patient taking differently: Take 20 mg by mouth every other day as needed) 23 tablet 6   meloxicam (MOBIC) 7.5 MG tablet Take 7.5 mg by mouth once daily   metoprolol succinate (TOPROL-XL) 25 MG XL tablet TAKE 1 TABLET BY MOUTH EVERY DAY 90 tablet 3   sacubitriL-valsartan (ENTRESTO) 24-26 mg tablet Take 1 tablet by mouth 2 (two) times daily   spironolactone (ALDACTONE) 25 MG tablet Take 0.5 tablets (12.5 mg total) by mouth once daily 45 tablet 3   methotrexate (RHEUMATREX) 2.5 MG tablet 2.5mg  every Saturday and Sunday (Patient taking differently: Take 2.5 mg by mouth every Saturday and Sunday 2.5mg  every Saturday and Sunday) 24 tablet 3   No current facility-administered medications for this visit.   Allergies:  Allergies  Allergen Reactions   Dotarem [Gadoterate Meglumine] Vomiting   Social History:  Social History   Socioeconomic History   Marital status: Married  Spouse name: Leta Jungling   Number of children: 3   Years of education: 12   Highest education level: High school graduate  Occupational History   Occupation: Disabled-Maintenance  Tobacco Use   Smoking status: Never Smoker   Smokeless tobacco: Never Used  Building services engineer Use: Never used  Substance and Sexual Activity   Alcohol use: Never   Drug use: No   Sexual activity:  Defer  Social History Narrative  ** Merged History Encounter **    Family History:  Family History  Problem Relation Age of Onset   High blood pressure (Hypertension) Mother  died 51 unknown cause   Diabetes type II Mother   Stroke Father   Review of Systems:   A 10+ ROS was performed, reviewed, and the pertinent orthopaedic findings are documented in the HPI.   Physical Examination:   BP (!) 140/80 (BP Location:  Left upper arm, Patient Position: Sitting, BP Cuff Size: Large Adult)  Ht 182.9 cm (6')  Wt (!) 132.4 kg (291 lb 12.8 oz)  BMI 39.58 kg/m   Patient is a well-developed, well-nourished male in no acute distress. Patient has normal mood and affect. Patient is alert and oriented to person, place, and time.   HEENT: Atraumatic, normocephalic. Pupils equal and reactive to light. Extraocular motion intact. Noninjected sclera.  Cardiovascular: Regular rate and rhythm, with no murmurs, rubs, or gallops. Distal pulses palpable. No carotid bruits.  Respiratory: Lungs clear to auscultation bilaterally.   Left  Knee:    Soft tissue swelling: none    Effusion:                   none    Erythema:                 none    Crepitance:               mild    Tenderness:             medial    Alignment:                relative varus    Mediolateral laxity:   medial pseudolaxity    Posterior sag:           negative    Patellar tracking:      Good tracking without evidence of subluxation or tilt    Atrophy:                    No significant atrophy.                                       Quadriceps tone was fair to good.    Range of motion:     0/0/127 degrees  Sensation intact over the saphenous, lateral sural cutaneous, superficial fibular, and deep fibular nerve distributions.  Tests Performed/Reviewed:  X-rays  X-ray knee left 3 views  Result Date: 05/27/2021 3 views of the left knee were obtained. Images reveal severe loss of medial compartment joint space with osteophyte formation. No fractures or dislocations. No other osseous abnormality noted   I personally ordered and interpreted today's x-rays.  Impression:   ICD-10-CM  1. Primary osteoarthritis of left knee M17.12   Plan:   The patient has end-stage degenerative changes of the left knee. It was explained to the patient that the condition is progressive in nature. Having failed conservative treatment, the patient has elected to  proceed with a total joint arthroplasty. The patient will undergo a total joint arthroplasty with Dr. Ernest Pine. The risks of surgery, including blood clot and infection, were discussed with the patient. Measures to reduce these risks, including the use of anticoagulation, perioperative antibiotics, and early ambulation were discussed. The importance of postoperative physical therapy was discussed with the patient. The patient elects  to proceed with surgery. The patient is instructed to stop all blood thinners prior to surgery. The patient is instructed to call the hospital the day before surgery to learn of the proper arrival time.   Contact our office with any questions or concerns. Follow up as indicated, or sooner should any new problems arise, if conditions worsen, or if they are otherwise concerned.   Michelene Gardener, PA -C [facility] [address] [place], [place] [address] Phone: [phone]  This note was generated in part with voice recognition software and I apologize for any typographical errors that were not detected and corrected.  Electronically signed by Michelene Gardener, PA at 05/27/2021 1:11 PM EDT

## 2021-06-02 ENCOUNTER — Observation Stay: Payer: No Typology Code available for payment source

## 2021-06-02 ENCOUNTER — Observation Stay
Admission: RE | Admit: 2021-06-02 | Discharge: 2021-06-04 | Disposition: A | Discharge status: Home / Self Care | Payer: No Typology Code available for payment source | Attending: Orthopedic Surgery | Admitting: Orthopedic Surgery

## 2021-06-02 ENCOUNTER — Ambulatory Visit: Payer: No Typology Code available for payment source | Admitting: Urgent Care

## 2021-06-02 ENCOUNTER — Encounter
Admission: RE | Disposition: A | Discharge status: Home / Self Care | Payer: Self-pay | Source: Home / Self Care | Attending: Orthopedic Surgery

## 2021-06-02 ENCOUNTER — Other Ambulatory Visit: Payer: Self-pay

## 2021-06-02 ENCOUNTER — Encounter: Payer: Self-pay | Admitting: Orthopedic Surgery

## 2021-06-02 DIAGNOSIS — Z79899 Other long term (current) drug therapy: Secondary | ICD-10-CM | POA: Diagnosis not present

## 2021-06-02 DIAGNOSIS — I110 Hypertensive heart disease with heart failure: Secondary | ICD-10-CM | POA: Insufficient documentation

## 2021-06-02 DIAGNOSIS — Z8616 Personal history of COVID-19: Secondary | ICD-10-CM | POA: Insufficient documentation

## 2021-06-02 DIAGNOSIS — I472 Ventricular tachycardia: Secondary | ICD-10-CM | POA: Insufficient documentation

## 2021-06-02 DIAGNOSIS — M1712 Unilateral primary osteoarthritis, left knee: Principal | ICD-10-CM | POA: Insufficient documentation

## 2021-06-02 DIAGNOSIS — I4729 Other ventricular tachycardia: Secondary | ICD-10-CM | POA: Insufficient documentation

## 2021-06-02 DIAGNOSIS — Z95810 Presence of automatic (implantable) cardiac defibrillator: Secondary | ICD-10-CM | POA: Diagnosis not present

## 2021-06-02 DIAGNOSIS — I509 Heart failure, unspecified: Secondary | ICD-10-CM | POA: Diagnosis not present

## 2021-06-02 DIAGNOSIS — D8685 Sarcoid myocarditis: Secondary | ICD-10-CM | POA: Insufficient documentation

## 2021-06-02 DIAGNOSIS — Z96659 Presence of unspecified artificial knee joint: Secondary | ICD-10-CM

## 2021-06-02 HISTORY — DX: Other ventricular tachycardia: I47.29

## 2021-06-02 HISTORY — DX: Sarcoid myocarditis: D86.85

## 2021-06-02 HISTORY — DX: Ventricular tachycardia: I47.2

## 2021-06-02 HISTORY — PX: KNEE ARTHROPLASTY: SHX992

## 2021-06-02 LAB — GLUCOSE, CAPILLARY: Glucose-Capillary: 167 mg/dL — ABNORMAL HIGH (ref 70–99)

## 2021-06-02 SURGERY — ARTHROPLASTY, KNEE, TOTAL, USING IMAGELESS COMPUTER-ASSISTED NAVIGATION
Anesthesia: General | Site: Knee | Laterality: Left

## 2021-06-02 MED ORDER — PROPOFOL 1000 MG/100ML IV EMUL
INTRAVENOUS | Status: AC
  Filled 2021-06-02: qty 100

## 2021-06-02 MED ORDER — ENSURE PRE-SURGERY PO LIQD
296.0000 mL | Freq: Once | ORAL | Status: AC
  Administered 2021-06-02: 296 mL via ORAL
  Filled 2021-06-02: qty 296

## 2021-06-02 MED ORDER — GABAPENTIN 300 MG PO CAPS
ORAL_CAPSULE | ORAL | Status: AC
  Administered 2021-06-02: 300 mg via ORAL
  Filled 2021-06-02: qty 1

## 2021-06-02 MED ORDER — SODIUM CHLORIDE 0.9 % IV SOLN: INTRAVENOUS | Status: DC

## 2021-06-02 MED ORDER — KETAMINE HCL 10 MG/ML IJ SOLN
INTRAMUSCULAR | Status: DC | PRN
  Administered 2021-06-02: 50 mg via INTRAVENOUS

## 2021-06-02 MED ORDER — TRANEXAMIC ACID-NACL 1000-0.7 MG/100ML-% IV SOLN
INTRAVENOUS | Status: AC
  Filled 2021-06-02: qty 100

## 2021-06-02 MED ORDER — CEFAZOLIN SODIUM-DEXTROSE 2-4 GM/100ML-% IV SOLN
2.0000 g | Freq: Four times a day (QID) | INTRAVENOUS | Status: AC
  Administered 2021-06-02 (×2): 2 g via INTRAVENOUS
  Filled 2021-06-02 (×2): qty 100

## 2021-06-02 MED ORDER — INSULIN ASPART 100 UNIT/ML IJ SOLN: 0.0000 [IU] | Freq: Every day | INTRAMUSCULAR | Status: DC

## 2021-06-02 MED ORDER — CEFAZOLIN SODIUM-DEXTROSE 2-4 GM/100ML-% IV SOLN
INTRAVENOUS | Status: AC
  Filled 2021-06-02: qty 100

## 2021-06-02 MED ORDER — ROCURONIUM BROMIDE 10 MG/ML (PF) SYRINGE
PREFILLED_SYRINGE | INTRAVENOUS | Status: AC
  Filled 2021-06-02: qty 10

## 2021-06-02 MED ORDER — CELECOXIB 200 MG PO CAPS: 400.0000 mg | ORAL_CAPSULE | Freq: Once | ORAL | Status: AC

## 2021-06-02 MED ORDER — SPIRONOLACTONE 25 MG PO TABS
25.0000 mg | ORAL_TABLET | Freq: Every day | ORAL | Status: DC
  Administered 2021-06-02 – 2021-06-04 (×3): 25 mg via ORAL
  Filled 2021-06-02 (×4): qty 1

## 2021-06-02 MED ORDER — PHENYLEPHRINE HCL (PRESSORS) 10 MG/ML IV SOLN
INTRAVENOUS | Status: AC
  Filled 2021-06-02: qty 1

## 2021-06-02 MED ORDER — METOCLOPRAMIDE HCL 10 MG PO TABS
10.0000 mg | ORAL_TABLET | Freq: Three times a day (TID) | ORAL | Status: AC
Start: 2021-06-02 — End: 2021-06-04
  Administered 2021-06-02 – 2021-06-04 (×8): 10 mg via ORAL
  Filled 2021-06-02 (×8): qty 1

## 2021-06-02 MED ORDER — METOPROLOL SUCCINATE ER 25 MG PO TB24
25.0000 mg | ORAL_TABLET | Freq: Every day | ORAL | Status: DC
  Filled 2021-06-02 (×2): qty 1

## 2021-06-02 MED ORDER — BUPIVACAINE HCL (PF) 0.25 % IJ SOLN
INTRAMUSCULAR | Status: DC | PRN
  Administered 2021-06-02: 60 mL

## 2021-06-02 MED ORDER — ACETAMINOPHEN 10 MG/ML IV SOLN
INTRAVENOUS | Status: AC
  Filled 2021-06-02: qty 100

## 2021-06-02 MED ORDER — FENTANYL CITRATE (PF) 100 MCG/2ML IJ SOLN
INTRAMUSCULAR | Status: AC
  Filled 2021-06-02: qty 2

## 2021-06-02 MED ORDER — DEXAMETHASONE SODIUM PHOSPHATE 10 MG/ML IJ SOLN
INTRAMUSCULAR | Status: AC
  Filled 2021-06-02: qty 1

## 2021-06-02 MED ORDER — CELECOXIB 200 MG PO CAPS
ORAL_CAPSULE | ORAL | Status: AC
  Administered 2021-06-02: 400 mg via ORAL
  Filled 2021-06-02: qty 2

## 2021-06-02 MED ORDER — SACUBITRIL-VALSARTAN 24-26 MG PO TABS
1.0000 | ORAL_TABLET | Freq: Two times a day (BID) | ORAL | Status: DC
  Administered 2021-06-02 – 2021-06-04 (×5): 1 via ORAL
  Filled 2021-06-02 (×6): qty 1

## 2021-06-02 MED ORDER — ESCITALOPRAM OXALATE 20 MG PO TABS
20.0000 mg | ORAL_TABLET | Freq: Every day | ORAL | Status: DC
  Filled 2021-06-02: qty 1

## 2021-06-02 MED ORDER — PHENYLEPHRINE 8 MG IN D5W 100 ML (0.08MG/ML) PREMIX OPTIME: INJECTION | INTRAVENOUS | Status: DC | PRN

## 2021-06-02 MED ORDER — ORAL CARE MOUTH RINSE: 15.0000 mL | Freq: Once | OROMUCOSAL | Status: AC

## 2021-06-02 MED ORDER — PREDNISONE 5 MG PO TABS
5.0000 mg | ORAL_TABLET | Freq: Every day | ORAL | Status: DC
  Administered 2021-06-02 – 2021-06-04 (×3): 5 mg via ORAL
  Filled 2021-06-02 (×3): qty 1

## 2021-06-02 MED ORDER — DIPHENHYDRAMINE HCL 12.5 MG/5ML PO ELIX
12.5000 mg | ORAL_SOLUTION | ORAL | Status: DC | PRN
  Filled 2021-06-02: qty 10

## 2021-06-02 MED ORDER — TRANEXAMIC ACID-NACL 1000-0.7 MG/100ML-% IV SOLN
1000.0000 mg | INTRAVENOUS | Status: AC
  Administered 2021-06-02: 1000 mg via INTRAVENOUS

## 2021-06-02 MED ORDER — FAMOTIDINE 20 MG PO TABS
ORAL_TABLET | ORAL | Status: AC
  Administered 2021-06-02: 20 mg via ORAL
  Filled 2021-06-02: qty 1

## 2021-06-02 MED ORDER — ACETAMINOPHEN 10 MG/ML IV SOLN
1000.0000 mg | Freq: Four times a day (QID) | INTRAVENOUS | Status: AC
  Administered 2021-06-02 – 2021-06-03 (×4): 1000 mg via INTRAVENOUS
  Filled 2021-06-02 (×4): qty 100

## 2021-06-02 MED ORDER — SODIUM CHLORIDE 0.9 % IR SOLN
Status: DC | PRN
  Administered 2021-06-02: 3000 mL

## 2021-06-02 MED ORDER — LIDOCAINE HCL (PF) 2 % IJ SOLN
INTRAMUSCULAR | Status: AC
  Filled 2021-06-02: qty 5

## 2021-06-02 MED ORDER — SENNOSIDES-DOCUSATE SODIUM 8.6-50 MG PO TABS
1.0000 | ORAL_TABLET | Freq: Two times a day (BID) | ORAL | Status: DC
  Administered 2021-06-02 – 2021-06-04 (×5): 1 via ORAL
  Filled 2021-06-02 (×5): qty 1

## 2021-06-02 MED ORDER — FOLIC ACID 1 MG PO TABS
1.0000 mg | ORAL_TABLET | Freq: Every day | ORAL | Status: DC
  Administered 2021-06-02 – 2021-06-04 (×3): 1 mg via ORAL
  Filled 2021-06-02 (×3): qty 1

## 2021-06-02 MED ORDER — SUGAMMADEX SODIUM 500 MG/5ML IV SOLN
INTRAVENOUS | Status: DC | PRN
  Administered 2021-06-02: 300 mg via INTRAVENOUS

## 2021-06-02 MED ORDER — MIDAZOLAM HCL 2 MG/2ML IJ SOLN
INTRAMUSCULAR | Status: DC | PRN
Start: 2021-06-02 — End: 2021-06-02
  Administered 2021-06-02: 2 mg via INTRAVENOUS

## 2021-06-02 MED ORDER — ENOXAPARIN SODIUM 30 MG/0.3ML IJ SOSY
30.0000 mg | PREFILLED_SYRINGE | Freq: Two times a day (BID) | INTRAMUSCULAR | Status: DC
  Administered 2021-06-03 – 2021-06-04 (×3): 30 mg via SUBCUTANEOUS
  Filled 2021-06-02 (×3): qty 0.3

## 2021-06-02 MED ORDER — ALUM & MAG HYDROXIDE-SIMETH 200-200-20 MG/5ML PO SUSP: 30.0000 mL | ORAL | Status: DC | PRN

## 2021-06-02 MED ORDER — DEXAMETHASONE SODIUM PHOSPHATE 10 MG/ML IJ SOLN: 8.0000 mg | Freq: Once | INTRAMUSCULAR | Status: AC

## 2021-06-02 MED ORDER — METHOTREXATE 2.5 MG PO TABS: 2.5000 mg | ORAL_TABLET | ORAL | Status: DC

## 2021-06-02 MED ORDER — PHENOL 1.4 % MT LIQD
1.0000 | OROMUCOSAL | Status: DC | PRN
  Filled 2021-06-02: qty 177

## 2021-06-02 MED ORDER — TRANEXAMIC ACID-NACL 1000-0.7 MG/100ML-% IV SOLN
1000.0000 mg | Freq: Once | INTRAVENOUS | Status: AC
  Administered 2021-06-02: 1000 mg via INTRAVENOUS

## 2021-06-02 MED ORDER — FAMOTIDINE 20 MG PO TABS: 20.0000 mg | ORAL_TABLET | Freq: Once | ORAL | Status: AC

## 2021-06-02 MED ORDER — OXYCODONE HCL 5 MG/5ML PO SOLN: 5.0000 mg | Freq: Once | ORAL | Status: DC | PRN

## 2021-06-02 MED ORDER — FLEET ENEMA 7-19 GM/118ML RE ENEM: 1.0000 | ENEMA | Freq: Once | RECTAL | Status: DC | PRN

## 2021-06-02 MED ORDER — ONDANSETRON HCL 4 MG PO TABS: 4.0000 mg | ORAL_TABLET | Freq: Four times a day (QID) | ORAL | Status: DC | PRN

## 2021-06-02 MED ORDER — LIDOCAINE HCL (CARDIAC) PF 100 MG/5ML IV SOSY
PREFILLED_SYRINGE | INTRAVENOUS | Status: DC | PRN
  Administered 2021-06-02: 100 mg via INTRAVENOUS

## 2021-06-02 MED ORDER — CHLORHEXIDINE GLUCONATE 4 % EX LIQD: 60.0000 mL | Freq: Once | CUTANEOUS | Status: DC

## 2021-06-02 MED ORDER — KETAMINE HCL 50 MG/5ML IJ SOSY
PREFILLED_SYRINGE | INTRAMUSCULAR | Status: AC
  Filled 2021-06-02: qty 5

## 2021-06-02 MED ORDER — ACETAMINOPHEN 10 MG/ML IV SOLN: 1000.0000 mg | Freq: Once | INTRAVENOUS | Status: DC | PRN

## 2021-06-02 MED ORDER — LACTATED RINGERS IV SOLN: INTRAVENOUS | Status: DC

## 2021-06-02 MED ORDER — ACETAMINOPHEN 325 MG PO TABS: 325.0000 mg | ORAL_TABLET | Freq: Four times a day (QID) | ORAL | Status: DC | PRN

## 2021-06-02 MED ORDER — PROPOFOL 10 MG/ML IV BOLUS
INTRAVENOUS | Status: DC | PRN
  Administered 2021-06-02: 50 mg via INTRAVENOUS

## 2021-06-02 MED ORDER — DEXAMETHASONE SODIUM PHOSPHATE 10 MG/ML IJ SOLN
INTRAMUSCULAR | Status: DC | PRN
  Administered 2021-06-02: 10 mg via INTRAVENOUS

## 2021-06-02 MED ORDER — MIDAZOLAM HCL 2 MG/2ML IJ SOLN
INTRAMUSCULAR | Status: AC
  Filled 2021-06-02: qty 2

## 2021-06-02 MED ORDER — FENTANYL CITRATE (PF) 100 MCG/2ML IJ SOLN
INTRAMUSCULAR | Status: DC | PRN
  Administered 2021-06-02: 100 ug via INTRAVENOUS

## 2021-06-02 MED ORDER — DEXTROSE 5 % IV SOLN
INTRAVENOUS | Status: DC | PRN
  Administered 2021-06-02: 3 g via INTRAVENOUS

## 2021-06-02 MED ORDER — NEOMYCIN-POLYMYXIN B GU 40-200000 IR SOLN
Status: DC | PRN
  Administered 2021-06-02: 14 mL

## 2021-06-02 MED ORDER — PANTOPRAZOLE SODIUM 40 MG PO TBEC
40.0000 mg | DELAYED_RELEASE_TABLET | Freq: Two times a day (BID) | ORAL | Status: DC
  Administered 2021-06-02 – 2021-06-04 (×5): 40 mg via ORAL
  Filled 2021-06-02 (×5): qty 1

## 2021-06-02 MED ORDER — CEFAZOLIN IN SODIUM CHLORIDE 3-0.9 GM/100ML-% IV SOLN
3.0000 g | INTRAVENOUS | Status: DC
  Filled 2021-06-02: qty 100

## 2021-06-02 MED ORDER — BISACODYL 10 MG RE SUPP
10.0000 mg | Freq: Every day | RECTAL | Status: DC | PRN
  Administered 2021-06-03 – 2021-06-04 (×2): 10 mg via RECTAL
  Filled 2021-06-02 (×3): qty 1

## 2021-06-02 MED ORDER — CHLORHEXIDINE GLUCONATE 0.12 % MT SOLN
OROMUCOSAL | Status: AC
  Administered 2021-06-02: 15 mL via OROMUCOSAL
  Filled 2021-06-02: qty 15

## 2021-06-02 MED ORDER — ONDANSETRON HCL 4 MG/2ML IJ SOLN
4.0000 mg | Freq: Four times a day (QID) | INTRAMUSCULAR | Status: DC | PRN
  Administered 2021-06-02: 4 mg via INTRAVENOUS
  Filled 2021-06-02: qty 2

## 2021-06-02 MED ORDER — FERROUS SULFATE 325 (65 FE) MG PO TABS
325.0000 mg | ORAL_TABLET | Freq: Two times a day (BID) | ORAL | Status: DC
  Administered 2021-06-02 – 2021-06-04 (×4): 325 mg via ORAL
  Filled 2021-06-02 (×4): qty 1

## 2021-06-02 MED ORDER — PROPOFOL 10 MG/ML IV BOLUS
INTRAVENOUS | Status: AC
  Filled 2021-06-02: qty 40

## 2021-06-02 MED ORDER — SODIUM CHLORIDE 0.9 % IV SOLN
INTRAVENOUS | Status: DC | PRN
  Administered 2021-06-02: 60 mL

## 2021-06-02 MED ORDER — DEXAMETHASONE SODIUM PHOSPHATE 10 MG/ML IJ SOLN
INTRAMUSCULAR | Status: AC
  Administered 2021-06-02: 8 mg via INTRAVENOUS
  Filled 2021-06-02: qty 1

## 2021-06-02 MED ORDER — FUROSEMIDE 20 MG PO TABS
20.0000 mg | ORAL_TABLET | ORAL | Status: DC
  Administered 2021-06-02 – 2021-06-04 (×2): 20 mg via ORAL
  Filled 2021-06-02 (×4): qty 1

## 2021-06-02 MED ORDER — OXYCODONE HCL 5 MG PO TABS
5.0000 mg | ORAL_TABLET | ORAL | Status: DC | PRN
  Administered 2021-06-03 – 2021-06-04 (×2): 5 mg via ORAL
  Filled 2021-06-02 (×3): qty 1

## 2021-06-02 MED ORDER — CHLORHEXIDINE GLUCONATE 0.12 % MT SOLN: 15.0000 mL | Freq: Once | OROMUCOSAL | Status: AC

## 2021-06-02 MED ORDER — MAGNESIUM HYDROXIDE 400 MG/5ML PO SUSP
30.0000 mL | Freq: Every day | ORAL | Status: DC
  Administered 2021-06-02 – 2021-06-04 (×3): 30 mL via ORAL
  Filled 2021-06-02 (×3): qty 30

## 2021-06-02 MED ORDER — FENTANYL CITRATE (PF) 100 MCG/2ML IJ SOLN
25.0000 ug | INTRAMUSCULAR | Status: DC | PRN
  Administered 2021-06-02: 25 ug via INTRAVENOUS

## 2021-06-02 MED ORDER — SODIUM CHLORIDE 0.9 % IV SOLN
INTRAVENOUS | Status: DC | PRN
  Administered 2021-06-02: 20 ug/min via INTRAVENOUS

## 2021-06-02 MED ORDER — ACETAMINOPHEN 10 MG/ML IV SOLN
INTRAVENOUS | Status: DC | PRN
Start: 2021-06-02 — End: 2021-06-02
  Administered 2021-06-02: 1000 mg via INTRAVENOUS

## 2021-06-02 MED ORDER — SUGAMMADEX SODIUM 500 MG/5ML IV SOLN
INTRAVENOUS | Status: AC
  Filled 2021-06-02: qty 5

## 2021-06-02 MED ORDER — HYDROMORPHONE HCL 1 MG/ML IJ SOLN: 0.5000 mg | INTRAMUSCULAR | Status: DC | PRN

## 2021-06-02 MED ORDER — OXYCODONE HCL 5 MG PO TABS
10.0000 mg | ORAL_TABLET | ORAL | Status: DC | PRN
  Administered 2021-06-02 (×2): 10 mg via ORAL
  Filled 2021-06-02 (×2): qty 2

## 2021-06-02 MED ORDER — SURGIPHOR WOUND IRRIGATION SYSTEM - OPTIME
TOPICAL | Status: DC | PRN
  Administered 2021-06-02: 1 via TOPICAL

## 2021-06-02 MED ORDER — SACUBITRIL-VALSARTAN 49-51 MG PO TABS: 1.0000 | ORAL_TABLET | Freq: Every day | ORAL | Status: DC

## 2021-06-02 MED ORDER — OXYCODONE HCL 5 MG PO TABS
ORAL_TABLET | ORAL | Status: AC
  Filled 2021-06-02: qty 2

## 2021-06-02 MED ORDER — ONDANSETRON HCL 4 MG/2ML IJ SOLN
INTRAMUSCULAR | Status: AC
  Filled 2021-06-02: qty 2

## 2021-06-02 MED ORDER — OXYCODONE HCL 5 MG PO TABS: 5.0000 mg | ORAL_TABLET | Freq: Once | ORAL | Status: DC | PRN

## 2021-06-02 MED ORDER — ROCURONIUM BROMIDE 100 MG/10ML IV SOLN
INTRAVENOUS | Status: DC | PRN
  Administered 2021-06-02: 20 mg via INTRAVENOUS
  Administered 2021-06-02: 30 mg via INTRAVENOUS
  Administered 2021-06-02: 50 mg via INTRAVENOUS

## 2021-06-02 MED ORDER — GABAPENTIN 300 MG PO CAPS: 300.0000 mg | ORAL_CAPSULE | Freq: Once | ORAL | Status: AC

## 2021-06-02 MED ORDER — MENTHOL 3 MG MT LOZG
1.0000 | LOZENGE | OROMUCOSAL | Status: DC | PRN
  Filled 2021-06-02: qty 9

## 2021-06-02 MED ORDER — TRAMADOL HCL 50 MG PO TABS
50.0000 mg | ORAL_TABLET | ORAL | Status: DC | PRN
  Administered 2021-06-02: 100 mg via ORAL
  Filled 2021-06-02: qty 2

## 2021-06-02 MED ORDER — 0.9 % SODIUM CHLORIDE (POUR BTL) OPTIME
TOPICAL | Status: DC | PRN
  Administered 2021-06-02: 500 mL

## 2021-06-02 MED ORDER — ONDANSETRON HCL 4 MG/2ML IJ SOLN
INTRAMUSCULAR | Status: DC | PRN
  Administered 2021-06-02: 4 mg via INTRAVENOUS

## 2021-06-02 MED ORDER — CELECOXIB 200 MG PO CAPS
200.0000 mg | ORAL_CAPSULE | Freq: Two times a day (BID) | ORAL | Status: DC
  Administered 2021-06-02 – 2021-06-04 (×5): 200 mg via ORAL
  Filled 2021-06-02 (×5): qty 1

## 2021-06-02 MED ORDER — INSULIN ASPART 100 UNIT/ML IJ SOLN
0.0000 [IU] | Freq: Three times a day (TID) | INTRAMUSCULAR | Status: DC
  Administered 2021-06-03 (×3): 2 [IU] via SUBCUTANEOUS
  Filled 2021-06-02 (×3): qty 1

## 2021-06-02 SURGICAL SUPPLY — 73 items
ATTUNE MED DOME PAT 41 KNEE (Knees) ×1 IMPLANT
ATTUNE PS FEM LT SZ 7 CEM KNEE (Femur) ×1 IMPLANT
ATTUNE PSRP INSR SZ7 5 KNEE (Insert) ×1 IMPLANT
BASE TIBIAL ATTUNE KNEE SZ9 (Knees) IMPLANT
BATTERY INSTRU NAVIGATION (MISCELLANEOUS) ×8 IMPLANT
BLADE SAW 70X12.5 (BLADE) ×2 IMPLANT
BLADE SAW 90X13X1.19 OSCILLAT (BLADE) ×2 IMPLANT
BLADE SAW 90X25X1.19 OSCILLAT (BLADE) ×2 IMPLANT
BONE CEMENT GENTAMICIN (Cement) ×4 IMPLANT
CEMENT BONE GENTAMICIN 40 (Cement) IMPLANT
COOLER POLAR GLACIER W/PUMP (MISCELLANEOUS) ×2 IMPLANT
DRAPE 3/4 80X56 (DRAPES) ×2 IMPLANT
DRAPE INCISE IOBAN 66X45 STRL (DRAPES) ×2 IMPLANT
DRSG DERMACEA 8X12 NADH (GAUZE/BANDAGES/DRESSINGS) ×2 IMPLANT
DRSG MEPILEX SACRM 8.7X9.8 (GAUZE/BANDAGES/DRESSINGS) ×2 IMPLANT
DRSG OPSITE POSTOP 4X14 (GAUZE/BANDAGES/DRESSINGS) ×2 IMPLANT
DRSG TEGADERM 4X4.75 (GAUZE/BANDAGES/DRESSINGS) ×2 IMPLANT
DURAPREP 26ML APPLICATOR (WOUND CARE) ×4 IMPLANT
ELECT CAUTERY BLADE 6.4 (BLADE) ×2 IMPLANT
ELECT REM PT RETURN 9FT ADLT (ELECTROSURGICAL) ×2
ELECTRODE REM PT RTRN 9FT ADLT (ELECTROSURGICAL) ×1 IMPLANT
EX-PIN ORTHOLOCK NAV 4X150 (PIN) ×4 IMPLANT
GLOVE SURG ENC TEXT LTX SZ7.5 (GLOVE) ×4 IMPLANT
GLOVE SURG UNDER POLY LF SZ7.5 (GLOVE) ×4 IMPLANT
GOWN STRL REUS W/ TWL LRG LVL3 (GOWN DISPOSABLE) ×2 IMPLANT
GOWN STRL REUS W/ TWL XL LVL3 (GOWN DISPOSABLE) ×1 IMPLANT
GOWN STRL REUS W/TWL LRG LVL3 (GOWN DISPOSABLE) ×2
GOWN STRL REUS W/TWL XL LVL3 (GOWN DISPOSABLE) ×1
HEMOVAC 400CC 10FR (MISCELLANEOUS) ×3 IMPLANT
HOLDER FOLEY CATH W/STRAP (MISCELLANEOUS) ×2 IMPLANT
INSERT TIB CR FIXED SZ7 5 (Insert) IMPLANT
INSRT TIB CR FIXED SZ7 5 (Insert) IMPLANT
IRRIGATION SURGIPHOR STRL (IV SOLUTION) ×2 IMPLANT
IV NS IRRIG 3000ML ARTHROMATIC (IV SOLUTION) ×2 IMPLANT
KIT TURNOVER KIT A (KITS) ×2 IMPLANT
KNIFE SCULPS 14X20 (INSTRUMENTS) ×2 IMPLANT
LABEL OR SOLS (LABEL) ×2 IMPLANT
MANIFOLD NEPTUNE II (INSTRUMENTS) ×4 IMPLANT
NDL SAFETY ECLIPSE 18X1.5 (NEEDLE) ×1 IMPLANT
NDL SPNL 20GX3.5 QUINCKE YW (NEEDLE) ×2 IMPLANT
NEEDLE HYPO 18GX1.5 SHARP (NEEDLE) ×1
NEEDLE SPNL 20GX3.5 QUINCKE YW (NEEDLE) ×4 IMPLANT
NS IRRIG 500ML POUR BTL (IV SOLUTION) ×2 IMPLANT
PACK TOTAL KNEE (MISCELLANEOUS) ×2 IMPLANT
PAD ABD DERMACEA PRESS 5X9 (GAUZE/BANDAGES/DRESSINGS) ×4 IMPLANT
PAD WRAPON POLOR MULTI XL (MISCELLANEOUS) IMPLANT
PENCIL SMOKE EVACUATOR COATED (MISCELLANEOUS) ×2 IMPLANT
PIN DRILL FIX HALF THREAD (BIT) ×4 IMPLANT
PIN DRILL QUICK PACK ×3 IMPLANT
PIN FIXATION 1/8DIA X 3INL (PIN) ×2 IMPLANT
PULSAVAC PLUS IRRIG FAN TIP (DISPOSABLE) ×2
SOL PREP PVP 2OZ (MISCELLANEOUS) ×2
SOLUTION PREP PVP 2OZ (MISCELLANEOUS) ×1 IMPLANT
SPONGE DRAIN TRACH 4X4 STRL 2S (GAUZE/BANDAGES/DRESSINGS) ×2 IMPLANT
SPONGE T-LAP 18X18 ~~LOC~~+RFID (SPONGE) ×6 IMPLANT
STAPLER SKIN PROX 35W (STAPLE) ×2 IMPLANT
STOCKINETTE IMPERV 14X48 (MISCELLANEOUS) ×1 IMPLANT
STRAP TIBIA SHORT (MISCELLANEOUS) ×2 IMPLANT
SUCTION FRAZIER HANDLE 10FR (MISCELLANEOUS) ×1
SUCTION TUBE FRAZIER 10FR DISP (MISCELLANEOUS) ×1 IMPLANT
SUT VIC AB 0 CT1 36 (SUTURE) ×4 IMPLANT
SUT VIC AB 1 CT1 36 (SUTURE) ×4 IMPLANT
SUT VIC AB 2-0 CT2 27 (SUTURE) ×2 IMPLANT
SYR 20ML LL LF (SYRINGE) ×2 IMPLANT
SYR 30ML LL (SYRINGE) ×4 IMPLANT
TIBIAL BASE ATTUNE KNEE SZ9 (Knees) ×2 IMPLANT
TIP FAN IRRIG PULSAVAC PLUS (DISPOSABLE) ×1 IMPLANT
TOWEL OR 17X26 4PK STRL BLUE (TOWEL DISPOSABLE) ×2 IMPLANT
TOWER CARTRIDGE SMART MIX (DISPOSABLE) ×2 IMPLANT
TRAY FOLEY MTR SLVR 16FR STAT (SET/KITS/TRAYS/PACK) ×2 IMPLANT
WATER STERILE IRR 500ML POUR (IV SOLUTION) ×2 IMPLANT
WRAP-ON POLOR PAD MULTI XL (MISCELLANEOUS) ×1
WRAPON POLOR PAD MULTI XL (MISCELLANEOUS) ×1

## 2021-06-02 NOTE — Transfer of Care (Signed)
Immediate Anesthesia Transfer of Care Note  Patient: Tony Whitney  Procedure(s) Performed: COMPUTER ASSISTED TOTAL KNEE ARTHROPLASTY (Left: Knee)  Patient Location: PACU  Anesthesia Type:General  Level of Consciousness: drowsy  Airway & Oxygen Therapy: Patient Spontanous Breathing and Patient connected to face mask oxygen  Post-op Assessment: Report given to RN and Post -op Vital signs reviewed and stable  Post vital signs: Reviewed and stable  Last Vitals:  Vitals Value Taken Time  BP 99/66 06/02/21 1105  Temp    Pulse 64 06/02/21 1114  Resp 20 06/02/21 1114  SpO2 96 % 06/02/21 1114  Vitals shown include unvalidated device data.  Last Pain:  Vitals:   06/02/21 0629  TempSrc: Oral         Complications: No notable events documented.

## 2021-06-02 NOTE — Evaluation (Signed)
Occupational Therapy Evaluation Patient Details Name: Tony Whitney MRN: [id number] DOB: 1960-11-10 Today's Date: 06/02/2021   History of Present Illness 50 M s/p L TKA. PMH: CHF, cardiomyopathy, and depression   Clinical Impression   Pt seen for OT evaluation this date, POD#0 from above surgery. Pt was independent in all ADLs, IADLs, and functional mobility prior to surgery. Pt is eager to return to PLOF with less pain and improved safety and independence. Pt currently requires SET-UP assist for seated UB ADLs and SUPERVISION/SET-UP to don/doff socks while in seated position due to pain and limited AROM of L knee. Pt was MOD-I for bed mobility this date, however further functional mobility deferred d/t pt positive and symptomatic for orthostatic hypotension during PT eval (which finished shortly before OT eval). Pt instructed in polar care mgt, falls prevention strategies, home/routines modifications, and DME/AE for LB bathing and dressing tasks. Pt able to teach-back education provided, requiring only MIN verbal cues. Pt would benefit from skilled OT services including additional instruction in techniques, with or without assistive devices, for dressing and bathing skills to support recall and carryover prior to discharge and ultimately to maximize safety, independence, and minimize falls risk and caregiver burden. Recommend HHOT upon discharge.         Recommendations for follow up therapy are one component of a multi-disciplinary discharge planning process, led by the attending physician.  Recommendations may be updated based on patient status, additional functional criteria and insurance authorization.   Follow Up Recommendations  Home health OT;Supervision - Intermittent    Equipment Recommendations  3 in 1 bedside commode       Precautions / Restrictions Precautions Precautions: Fall Restrictions Weight Bearing Restrictions: Yes LLE Weight Bearing: Weight bearing as tolerated       Mobility Bed Mobility Overal bed mobility: Modified Independent             General bed mobility comments: With [facility] elevated, pt able to perform with ease. No physical assist required    Transfers                 General transfer comment: deferred d/t pt postive for and symptomatic for orthostatic hypotension during PT eval (which finished shortly before start of OT eval)    Balance Overall balance assessment: Needs assistance Sitting-balance support: No upper extremity supported;Feet supported Sitting balance-Leahy Scale: Good Sitting balance - Comments: Good sitting balance reaching outside BOS of support to don/doff socks at EOB                                   ADL either performed or assessed with clinical judgement   ADL Overall ADL's : Needs assistance/impaired                                       General ADL Comments: SET-UP assist for seated UB ADLs and SUPERVISION/SET-UP to don/doff socks while seated at EOB     Vision Patient Visual Report: No change from baseline              Pertinent Vitals/Pain Pain Assessment: 0-10 Pain Score: 6  Pain Location: L knee Pain Descriptors / Indicators: Aching Pain Intervention(s): Limited activity within patient's tolerance;Monitored during session        Extremity/Trunk Assessment Upper Extremity Assessment Upper Extremity Assessment: Overall WFL for tasks assessed  Lower Extremity Assessment Lower Extremity Assessment: Defer to PT evaluation       Communication Communication Communication: No difficulties   Cognition Arousal/Alertness: Awake/alert Behavior During Therapy: WFL for tasks assessed/performed Overall Cognitive Status: Within Functional Limits for tasks assessed                                 General Comments: Pt lethargic at start of session, however alertness improving once seated at EOB. Pt able to teach-back education provided,  requiring only MIN verbal cues      Exercises Other Exercises Other Exercises: Pt instructed in polar care mgt, falls prevention strategies, home/routines modifications, and DME/AE for LB bathing and dressing tasks; pt & spouse verba;ized understanding and handout provided        Home Living Family/patient expects to be discharged to:: Private residence Living Arrangements: Spouse/significant other Available Help at Discharge: Family Type of Home: House Home Access: Stairs to enter Secretary/administrator of Steps: 3   Home Layout: Two level;Bed/bath upstairs Alternate Level Stairs-Number of Steps: 12   Bathroom Shower/Tub: Runner, broadcasting/film/video: Shower seat - built in          Prior Functioning/Environment Level of Independence: Independent        Comments: At baseline, pt is independent with ADLs, IADLs, and functional mobility (without AD). Pt drives        OT Problem List: Decreased range of motion;Impaired balance (sitting and/or standing);Pain      OT Treatment/Interventions: Self-care/ADL training;Therapeutic exercise;DME and/or AE instruction;Therapeutic activities;Patient/family education;Balance training    OT Goals(Current goals can be found in the care plan section) Acute Rehab OT Goals Patient Stated Goal: to return to PLOF OT Goal Formulation: With patient Time For Goal Achievement: 06/16/21 Potential to Achieve Goals: Good ADL Goals Pt Will Perform Grooming: with modified independence;standing Pt Will Perform Lower Body Dressing: with modified independence;sit to/from stand;with adaptive equipment Pt Will Transfer to Toilet: with modified independence;ambulating;bedside commode  OT Frequency: Min 1X/week    AM-PAC OT "6 Clicks" Daily Activity     Outcome Measure Help from another person eating meals?: None Help from another person taking care of personal grooming?: None Help from another person toileting, which includes using  toliet, bedpan, or urinal?: A Little Help from another person bathing (including washing, rinsing, drying)?: A Little Help from another person to put on and taking off regular upper body clothing?: None Help from another person to put on and taking off regular lower body clothing?: A Little 6 Click Score: 21   End of Session Nurse Communication: Mobility status  Activity Tolerance: Patient tolerated treatment well Patient left: in bed;with call bell/phone within reach;with bed alarm set;with family/visitor present  OT Visit Diagnosis: Unsteadiness on feet (R26.81);Pain Pain - Right/Left: Left Pain - part of body: Knee                Time: 1534-1600 OT Time Calculation (min): 26 min Charges:  OT General Charges $OT Visit: 1 Visit OT Evaluation $OT Eval Moderate Complexity: 1 Mod OT Treatments $Self Care/Home Management : 8-22 mins  Matthew Folks, OTR/L [facility] [phone]

## 2021-06-02 NOTE — Anesthesia Procedure Notes (Signed)
Procedure Name: Intubation Date/Time: 06/02/2021 7:29 AM Performed by: Debe Coder, CRNA Pre-anesthesia Checklist: Patient identified, Emergency Drugs available, Suction available and Patient being monitored Patient Re-evaluated:Patient Re-evaluated prior to induction Oxygen Delivery Method: Circle system utilized Preoxygenation: Pre-oxygenation with 100% oxygen Induction Type: IV induction Ventilation: Mask ventilation without difficulty Laryngoscope Size: Mac and 3 Grade View: Grade I Tube type: Oral Tube size: 7.5 mm Number of attempts: 1 Airway Equipment and Method: Stylet and Oral airway Placement Confirmation: ETT inserted through vocal cords under direct vision, positive ETCO2 and breath sounds checked- equal and bilateral Secured at: 23 cm Tube secured with: Tape Dental Injury: Teeth and Oropharynx as per pre-operative assessment

## 2021-06-02 NOTE — H&P (Signed)
The patient has been re-examined, and the chart reviewed, and there have been no interval changes to the documented history and physical.    The risks, benefits, and alternatives have been discussed at length. The patient expressed understanding of the risks benefits and agreed with plans for surgical intervention.  Selda Jalbert P. Shelda Truby, Jr. M.D.    

## 2021-06-02 NOTE — Anesthesia Postprocedure Evaluation (Signed)
Anesthesia Post Note  Patient: Skyler Carel  Procedure(s) Performed: COMPUTER ASSISTED TOTAL KNEE ARTHROPLASTY (Left: Knee)  Patient location during evaluation: PACU Anesthesia Type: General Level of consciousness: awake and alert Pain management: pain level controlled Vital Signs Assessment: post-procedure vital signs reviewed and stable Respiratory status: spontaneous breathing, nonlabored ventilation, respiratory function stable and patient connected to nasal cannula oxygen Cardiovascular status: blood pressure returned to baseline and stable Postop Assessment: no apparent nausea or vomiting Anesthetic complications: no   No notable events documented.   Last Vitals:  Vitals:   06/02/21 1215 06/02/21 1234  BP: (!) 94/59 95/61  Pulse: 65 62  Resp: 20 18  Temp: (!) 36.2 C 36.6 C  SpO2: 93% 93%    Last Pain:  Vitals:   06/02/21 1215  TempSrc:   PainSc: Asleep                 Foye Deer

## 2021-06-02 NOTE — Progress Notes (Signed)
Vitals entered manually- wouldn't recognize patient ID

## 2021-06-02 NOTE — Op Note (Signed)
OPERATIVE NOTE  DATE OF SURGERY:  06/02/2021  PATIENT NAME:  Tony Whitney   DOB: [birth year]  MRN: [id number]  PRE-OPERATIVE DIAGNOSIS: Degenerative arthrosis of the left knee, primary  POST-OPERATIVE DIAGNOSIS:  Same  PROCEDURE:  Left total knee arthroplasty using computer-assisted navigation  SURGEON:  Jena Gauss. M.D.  ANESTHESIA: general  ESTIMATED BLOOD LOSS: 50 mL  FLUIDS REPLACED: 500 mL of crystalloid  TOURNIQUET TIME: 91 minutes  DRAINS: 2 medium Hemovac drains  SOFT TISSUE RELEASES: Anterior cruciate ligament, posterior cruciate ligament, deep  medial collateral ligament, patellofemoral ligament  IMPLANTS UTILIZED: DePuy Attune size 7 posterior stabilized femoral component (cemented), size 9 rotating platform tibial component (cemented), 41 mm medialized dome patella (cemented), and a 5 mm stabilized rotating platform polyethylene insert.  INDICATIONS FOR SURGERY: Tony Whitney is a 60 y.o. year old male with a long history of progressive knee pain. X-rays demonstrated severe degenerative changes in tricompartmental fashion. The patient had not seen any significant improvement despite conservative nonsurgical intervention. After discussion of the risks and benefits of surgical intervention, the patient expressed understanding of the risks benefits and agree with plans for total knee arthroplasty.   The risks, benefits, and alternatives were discussed at length including but not limited to the risks of infection, bleeding, nerve injury, stiffness, blood clots, the need for revision surgery, cardiopulmonary complications, among others, and they were willing to proceed.  PROCEDURE IN DETAIL: The patient was brought into the operating room and, after adequate general anesthesia was achieved, a tourniquet was placed on the patient's upper thigh. The patient's knee and leg were cleaned and prepped with alcohol and DuraPrep and draped in the usual sterile fashion. A  "timeout" was performed as per usual protocol. The lower extremity was exsanguinated using an Esmarch, and the tourniquet was inflated to 300 mmHg. An anterior longitudinal incision was made followed by a standard mid vastus approach. The deep fibers of the medial collateral ligament were elevated in a subperiosteal fashion off of the medial flare of the tibia so as to maintain a continuous soft tissue sleeve. The patella was subluxed laterally and the patellofemoral ligament was incised. Inspection of the knee demonstrated severe degenerative changes with full-thickness loss of articular cartilage. Osteophytes were debrided using a rongeur. Anterior and posterior cruciate ligaments were excised. Two 4.0 mm Schanz pins were inserted in the femur and into the tibia for attachment of the array of trackers used for computer-assisted navigation. Hip center was identified using a circumduction technique. Distal landmarks were mapped using the computer. The distal femur and proximal tibia were mapped using the computer. The distal femoral cutting guide was positioned using computer-assisted navigation so as to achieve a 5 distal valgus cut. The femur was sized and it was felt that a size 7 femoral component was appropriate. A size 7 femoral cutting guide was positioned and the anterior cut was performed and verified using the computer. This was followed by completion of the posterior and chamfer cuts. Femoral cutting guide for the central box was then positioned in the center box cut was performed.  Attention was then directed to the proximal tibia. Medial and lateral menisci were excised. The extramedullary tibial cutting guide was positioned using computer-assisted navigation so as to achieve a 0 varus-valgus alignment and 3 posterior slope. The cut was performed and verified using the computer. The proximal tibia was sized and it was felt that a size 9 tibial tray was appropriate. Tibial and femoral trials were  inserted followed by  insertion of a 5 mm polyethylene insert. This allowed for excellent mediolateral soft tissue balancing both in flexion and in full extension. Finally, the patella was cut and prepared so as to accommodate a 41 mm medialized dome patella. A patella trial was placed and the knee was placed through a range of motion with excellent patellar tracking appreciated. The femoral trial was removed after debridement of posterior osteophytes. The central post-hole for the tibial component was reamed followed by insertion of a keel punch. Tibial trials were then removed. Cut surfaces of bone were irrigated with copious amounts of normal saline using pulsatile lavage and then suctioned dry. Polymethylmethacrylate cement with gentamicin was prepared in the usual fashion using a vacuum mixer. Cement was applied to the cut surface of the proximal tibia as well as along the undersurface of a size 9 rotating platform tibial component. Tibial component was positioned and impacted into place. Excess cement was removed using Personal assistant. Cement was then applied to the cut surfaces of the femur as well as along the posterior flanges of the size 7 femoral component. The femoral component was positioned and impacted into place. Excess cement was removed using Personal assistant. A 5 mm polyethylene trial was inserted and the knee was brought into full extension with steady axial compression applied. Finally, cement was applied to the backside of a 41 mm medialized dome patella and the patellar component was positioned and patellar clamp applied. Excess cement was removed using Personal assistant. After adequate curing of the cement, the tourniquet was deflated after a total tourniquet time of 91 minutes. Hemostasis was achieved using electrocautery. The knee was irrigated with copious amounts of normal saline using pulsatile lavage followed by 500 ml of Surgiphor and then suctioned dry. 20 mL of 1.3% Exparel and 60 mL of  0.25% Marcaine in 40 mL of normal saline was injected along the posterior capsule, medial and lateral gutters, and along the arthrotomy site. A 5 mm stabilized rotating platform polyethylene insert was inserted and the knee was placed through a range of motion with excellent mediolateral soft tissue balancing appreciated and excellent patellar tracking noted. 2 medium drains were placed in the wound bed and brought out through separate stab incisions. The medial parapatellar portion of the incision was reapproximated using interrupted sutures of #1 Vicryl. Subcutaneous tissue was approximated in layers using first #0 Vicryl followed #2-0 Vicryl. The skin was approximated with skin staples. A sterile dressing was applied.  The patient tolerated the procedure well and was transported to the recovery room in stable condition.    Anjela Cassara P. Angie Fava., M.D.

## 2021-06-02 NOTE — Evaluation (Signed)
Physical Therapy Evaluation Patient Details Name: Tony Whitney MRN: [id number] DOB: September 01, 1961 Today's Date: 06/02/2021  History of Present Illness  Pt is a 60 year old male who underwent elective L TKA with Dr. Ernest Pine on 06/02/21. 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 Pt is currently WBAT per orders.   Clinical Impression  Pt is a pleasant 60 year old male who presents to PT evaluation s/p day #0 L TKA. Prior to admission, pt was independent with all of his mobility and did not ambulate with assistive devices. Currently, pt requiring minA for sit to stand transfers from elevated surfaces. Pt able to complete straight leg raises and will not require usage of knee immobilizer. Pt was limited by a drop in blood pressure from 117/73 mmHg sitting at EOB to 95/65 mmHg in standing. Deferred further ambulation however, pt able to take sidesteps at EOB for repositioning. Removed 2L of oxygen throughout session and patient remained >93% on room air. Pt will be required to complete stairs at discharge for home entry and family education should be provided for patient safety. Recommending HHPT and supervision from family with OOB mobility. Pt will benefit from skilled PT services to improve mobility and reduce fall risk to prepare for discharge home with spouse.   This entire session was guided, instructed, and directly supervised by  [place], DPT.      Recommendations for follow up therapy are one component of a multi-disciplinary discharge planning process, led by the attending physician.  Recommendations may be updated based on patient status, additional functional criteria and insurance authorization.  Follow Up Recommendations Home health PT;Supervision for mobility/OOB    Equipment Recommendations  Rolling walker with 5" wheels;3in1 (PT)    Recommendations for Other Services       Precautions / Restrictions  Precautions Precautions: Fall Restrictions Weight Bearing Restrictions: Yes LLE Weight Bearing: Weight bearing as tolerated      Mobility  Bed Mobility Overal bed mobility: Needs Assistance Bed Mobility: Supine to Sit;Sit to Supine     Supine to sit: Min assist;HOB elevated Sit to supine: Min assist   General bed mobility comments: HOb elevated for supine to sit and assistance with movement of L LE off EOB. Assistance with L LE back into bed at end of session.    Transfers Overall transfer level: Needs assistance Equipment used: Rolling walker (2 wheeled) Transfers: Sit to/from Stand Sit to Stand: Min assist;From elevated surface         General transfer comment: Bed elevated significantly for patient success. Pt requiring verbal cues for hand placement on bed for improved safety. Pt required 2 attempts of rocking prior to standing.  Ambulation/Gait Ambulation/Gait assistance: Min assist Gait Distance (Feet): 2 Feet Assistive device: Rolling walker (2 wheeled) Gait Pattern/deviations: Step-to pattern;Antalgic     General Gait Details: Sidestepping at EOB to reposition. Pt requiring verbal cues for sequencing and usage of UEs to offload LLE to his tolerance. Pt educated on WBAT status  Stairs            Wheelchair Mobility    Modified Rankin (Stroke Patients Only)       Balance Overall balance assessment: Needs assistance Sitting-balance support: No upper extremity supported;Feet supported Sitting balance-Leahy Scale: Good Sitting balance - Comments: Good sitting balance reaching outside BOS of support to don/doff socks at EOB   Standing balance support: Bilateral upper extremity supported;During functional activity Standing balance-Leahy Scale: Fair Standing balance comment: Requiring B UE assistance with  initial standing and pt reporting increased dizziness in standing.           Pertinent Vitals/Pain Pain Assessment: 0-10 Pain Score: 5  Pain  Location: L knee Pain Descriptors / Indicators: Aching;Grimacing Pain Intervention(s): Limited activity within patient's tolerance;Monitored during session;Premedicated before session;Repositioned;Ice applied    Home Living Family/patient expects to be discharged to:: Private residence Living Arrangements: Spouse/significant other Available Help at Discharge: Family Type of Home: House Home Access: Stairs to enter Entrance Stairs-Rails: None Entrance Stairs-Number of Steps: 3 Home Layout: Two level;1/2 bath on main level;Bed/bath upstairs Home Equipment: Walker - standard;Shower seat - built in;Grab bars - toilet Additional Comments: Pt's spouse is available 24/7 at discharge    Prior Function Level of Independence: Independent         Comments: No usage of AD prior to surgery     Hand Dominance        Extremity/Trunk Assessment   Upper Extremity Assessment Upper Extremity Assessment: Overall WFL for tasks assessed    Lower Extremity Assessment Lower Extremity Assessment: LLE deficits/detail LLE Deficits / Details: Decreased AROM of L knee; Grossly 3/5 with standing with RW LLE Sensation: WNL       Communication   Communication: No difficulties  Cognition Arousal/Alertness: Awake/alert Behavior During Therapy: WFL for tasks assessed/performed Overall Cognitive Status: Within Functional Limits for tasks assessed        General Comments: Pt lethargic intially, improved with sitting EOB and completing exercises      General Comments      Exercises Total Joint Exercises Quad Sets: AROM;Strengthening;Left;10 reps;Supine Heel Slides: Strengthening;Left;AROM;5 reps;Supine;Seated Straight Leg Raises: AROM;Left;10 reps;Supine Goniometric ROM: 12 - 80 degrees Other Exercises Other Exercises: Pt instructed on DME equipment recommendations for safe discharge home and discharge recommendations for HHPT. Instructed patient on no pillows under his knees to promote knee  extension with usage of bonefoam.   Assessment/Plan    PT Assessment Patient needs continued PT services  PT Problem List Decreased strength;Decreased range of motion;Decreased activity tolerance;Decreased balance;Decreased mobility;Pain       PT Treatment Interventions DME instruction;Gait training;Stair training;Functional mobility training;Therapeutic activities;Therapeutic exercise;Balance training;Neuromuscular re-education;Patient/family education    PT Goals (Current goals can be found in the Care Plan section)  Acute Rehab PT Goals Patient Stated Goal: to return to PLOF PT Goal Formulation: With patient/family Time For Goal Achievement: 06/16/21 Potential to Achieve Goals: Good    Frequency BID   Barriers to discharge        Co-evaluation               AM-PAC PT "6 Clicks" Mobility  Outcome Measure Help needed turning from your back to your side while in a flat bed without using bedrails?: A Little Help needed moving from lying on your back to sitting on the side of a flat bed without using bedrails?: A Little Help needed moving to and from a bed to a chair (including a wheelchair)?: A Little Help needed standing up from a chair using your arms (e.g., wheelchair or bedside chair)?: A Little Help needed to walk in hospital room?: A Little Help needed climbing 3-5 steps with a railing? : A Lot 6 Click Score: 17    End of Session Equipment Utilized During Treatment: Gait belt Activity Tolerance: Patient tolerated treatment well Patient left: in bed;with call bell/phone within reach;with bed alarm set;with family/visitor present;with SCD's reapplied Nurse Communication: Mobility status PT Visit Diagnosis: Unsteadiness on feet (R26.81);Muscle weakness (generalized) (M62.81);Other abnormalities of gait and  mobility (R26.89)    Time: 0223-3612 PT Time Calculation (min) (ACUTE ONLY): 35 min   Charges:   PT Evaluation $PT Eval Low Complexity: 1 Low PT  Treatments $Therapeutic Exercise: 8-22 mins $Therapeutic Activity: 8-22 mins        Verl Blalock, SPT   Verl Blalock 06/02/2021, 5:07 PM

## 2021-06-03 ENCOUNTER — Encounter: Payer: Self-pay | Admitting: Orthopedic Surgery

## 2021-06-03 DIAGNOSIS — M1712 Unilateral primary osteoarthritis, left knee: Secondary | ICD-10-CM | POA: Diagnosis not present

## 2021-06-03 LAB — GLUCOSE, CAPILLARY
Glucose-Capillary: 127 mg/dL — ABNORMAL HIGH (ref 70–99)
Glucose-Capillary: 149 mg/dL — ABNORMAL HIGH (ref 70–99)
Glucose-Capillary: 133 mg/dL — ABNORMAL HIGH (ref 70–99)
Glucose-Capillary: 131 mg/dL — ABNORMAL HIGH (ref 70–99)

## 2021-06-03 MED ORDER — OXYCODONE HCL 5 MG PO TABS: 5.0000 mg | ORAL_TABLET | ORAL | 0 refills | Status: AC | PRN

## 2021-06-03 MED ORDER — ADULT MULTIVITAMIN W/MINERALS CH
1.0000 | ORAL_TABLET | Freq: Every day | ORAL | Status: DC
  Administered 2021-06-03 – 2021-06-04 (×2): 1 via ORAL
  Filled 2021-06-03 (×2): qty 1

## 2021-06-03 MED ORDER — ENOXAPARIN SODIUM 40 MG/0.4ML IJ SOSY: 40.0000 mg | PREFILLED_SYRINGE | INTRAMUSCULAR | 0 refills | Status: AC

## 2021-06-03 MED ORDER — TRAMADOL HCL 50 MG PO TABS: 50.0000 mg | ORAL_TABLET | ORAL | 0 refills | Status: AC | PRN

## 2021-06-03 MED ORDER — CELECOXIB 200 MG PO CAPS: 200.0000 mg | ORAL_CAPSULE | Freq: Two times a day (BID) | ORAL | 1 refills | Status: AC

## 2021-06-03 MED ORDER — ENSURE ENLIVE PO LIQD
237.0000 mL | Freq: Two times a day (BID) | ORAL | Status: DC
  Administered 2021-06-03: 237 mL via ORAL

## 2021-06-03 NOTE — Progress Notes (Signed)
Occupational Therapy Treatment Patient Details Name: Lue Dubuque MRN: [id number] DOB: 1961/01/26 Today's Date: 06/03/2021   History of present illness Pt is a 60 year old male who underwent elective L TKA with Dr. Ernest Pine on 06/02/21. 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 Pt is currently WBAT per orders.   OT comments  Pt seen for OT treatment on this date. Upon arrival to room, pt awake and seated upright in bed. Pt with improved alertness this date, reporting 2/10 pain, and agreeable to OT tx. At start of session, pt able to recall education provided yesterday regarding polar care management. Pt currently requires MIN GUARD for seated LB dressing, SET-UP for seated grooming, tasks, and MIN GUARD for stand pivot transfers to/from bariatric BSC. Of note, pt with x1 LOB during transfer to [facility] (during first attempt of three), however demonstrated improved balance in subsequent attempts. At end of session, pt & pt's spouse verbalizing concern regarding level of assist pt currently requires for functional mobility. Considering pt's functional mobility this date and discussion with pt/family, discharge recommendation changed to SNF to maximize return to PLOF and minimize risk of future falls, injury, caregiver burden, and readmission.    Recommendations for follow up therapy are one component of a multi-disciplinary discharge planning process, led by the attending physician.  Recommendations may be updated based on patient status, additional functional criteria and insurance authorization.    Follow Up Recommendations  SNF    Equipment Recommendations  3 in 1 bedside commode;Other (comment) (bariatric BSC)       Precautions / Restrictions Precautions Precautions: Fall Restrictions Weight Bearing Restrictions: Yes LLE Weight Bearing: Weight bearing as tolerated       Mobility Bed Mobility Overal bed mobility: Needs  Assistance Bed Mobility: Sit to Supine       Sit to supine: Min assist   General bed mobility comments: not assessed, pt in recliner at beginning/end of session    Transfers Overall transfer level: Needs assistance Equipment used: Rolling walker (2 wheeled) Transfers: Sit to/from [employer] Sit to Stand: Min guard Stand pivot transfers: Min guard       General transfer comment: Requires verbal cues for safe hand placement with RW use    Balance Overall balance assessment: Needs assistance Sitting-balance support: No upper extremity supported;Feet supported Sitting balance-Leahy Scale: Good Sitting balance - Comments: Good sitting balance reaching outside BOS of support to don/doff socks   Standing balance support: Bilateral upper extremity supported;During functional activity Standing balance-Leahy Scale: Fair Standing balance comment: Requires UE support on RW to maintain dynamic standing balance                           ADL either performed or assessed with clinical judgement   ADL Overall ADL's : Needs assistance/impaired     Grooming: Wash/dry hands;Set up;Sitting               Lower Body Dressing: Min guard;Sitting/lateral leans Lower Body Dressing Details (indicate cue type and reason): to don/doff socks Toilet Transfer: Min guard;Stand-pivot;BSC;Requires Financial risk analyst Details (indicate cue type and reason): x3 trials; x1 LOB during initial transfer however able to progress to SUPERVISION in subsequent attempts                Cognition Arousal/Alertness: Awake/alert Behavior During Therapy: WFL for tasks assessed/performed Overall Cognitive Status: Within Functional Limits for tasks assessed  General Comments: Improve alertness this date                   Pertinent Vitals/ Pain       Pain Assessment: 0-10 Pain Score: 2 Pain Location: L knee Pain  Descriptors / Indicators: Aching;Sore;Discomfort Pain Intervention(s): Limited activity within patient's tolerance;Monitored during session;Utilized relaxation techniques;Ice applied         Frequency  Min 1X/week        Progress Toward Goals  OT Goals(current goals can now be found in the care plan section)  Progress towards OT goals: Progressing toward goals  Acute Rehab OT Goals Patient Stated Goal: to return to PLOF OT Goal Formulation: With patient Time For Goal Achievement: 06/16/21 Potential to Achieve Goals: Good  Plan Discharge plan needs to be updated;Frequency remains appropriate       AM-PAC OT "6 Clicks" Daily Activity     Outcome Measure   Help from another person eating meals?: None Help from another person taking care of personal grooming?: A Little Help from another person toileting, which includes using toliet, bedpan, or urinal?: A Little Help from another person bathing (including washing, rinsing, drying)?: A Little Help from another person to put on and taking off regular upper body clothing?: None Help from another person to put on and taking off regular lower body clothing?: A Little 6 Click Score: 20    End of Session Equipment Utilized During Treatment: Gait belt;Rolling walker  OT Visit Diagnosis: Unsteadiness on feet (R26.81);Pain Pain - part of body: Knee   Activity Tolerance Patient tolerated treatment well   Patient Left in chair;with call bell/phone within reach;with chair alarm set   Nurse Communication Mobility status        Time: 1610-9604 OT Time Calculation (min): 31 min  Charges: OT General Charges $OT Visit: 1 Visit OT Treatments $Self Care/Home Management : 23-37 mins  Matthew Folks, OTR/L [facility] [phone]

## 2021-06-03 NOTE — Progress Notes (Signed)
Physical Therapy Treatment Patient Details Name: Tony Whitney MRN: [id number] DOB: 1961-08-20 Today's Date: 06/03/2021   History of Present Illness Pt is a 60 year old male who underwent elective L TKA with Dr. Ernest Pine on 06/02/21. 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 Pt is currently WBAT per orders.    PT Comments    Pt received seated in recliner chair with spouse present. Pt and spouse agreeable to SNF recommendation prior to discharge home due to concerns of safety. Pt able to complete sit to stand transfer from recliner chair with B UE assistance and minA for balance. Pt ambulated 1 bout of 10 ft with reports of dizziness requiring seated rest break with BP at 120/78 mmHg. Ambulated additional 40 ft with RW + MinA and verbal cues for turning to improve balance and no reports of dizziness. Pt will continue to benefit from skilled PT services to improve balance to reduce likelihood of falls, improve activity tolerance and reduce caregiver burden.    Recommendations for follow up therapy are one component of a multi-disciplinary discharge planning process, led by the attending physician.  Recommendations may be updated based on patient status, additional functional criteria and insurance authorization.  Follow Up Recommendations  SNF     Equipment Recommendations  Rolling walker with 5" wheels;3in1 (PT)    Recommendations for Other Services       Precautions / Restrictions Precautions Precautions: Fall Restrictions Weight Bearing Restrictions: Yes LLE Weight Bearing: Weight bearing as tolerated     Mobility  Bed Mobility Overal bed mobility: Needs Assistance Bed Mobility: Sit to Supine       Sit to supine: Min assist   General bed mobility comments: Pt able to initiate movement of L LE into bed requiring minA for completion. Pt able to readjust himself towards center of bed    Transfers Overall  transfer level: Needs assistance Equipment used: Rolling walker (2 wheeled) Transfers: Sit to/from Stand Sit to Stand: Min guard         General transfer comment: From recliner chair with heavy usage of B UEs to push from.  Ambulation/Gait Ambulation/Gait assistance: Min assist Gait Distance (Feet): 50 Feet (1 bout 10 ft; 1 bout 40 ft) Assistive device: Rolling walker (2 wheeled) Gait Pattern/deviations: Step-to pattern;Decreased stride length;Antalgic     General Gait Details: Demonstrating improvements of fluidity of gait on second bout of ambulation with verbal cues for pushing RW. Pt reporting feeling more natural to step with R foot initially. Pt cued for RW management during turns to improve balance   Stairs             Wheelchair Mobility    Modified Rankin (Stroke Patients Only)       Balance Overall balance assessment: Needs assistance Sitting-balance support: No upper extremity supported;Feet supported Sitting balance-Leahy Scale: Good     Standing balance support: Bilateral upper extremity supported;During functional activity Standing balance-Leahy Scale: Fair Standing balance comment: Heavy UE usage and requiring verbal cues for RW mangement during turns to improve balance      Cognition Arousal/Alertness: Awake/alert Behavior During Therapy: WFL for tasks assessed/performed Overall Cognitive Status: Within Functional Limits for tasks assessed         Exercises Total Joint Exercises Ankle Circles/Pumps: Seated;Both;20 reps;AROM;Strengthening Long Arc Quad: AROM;Strengthening;Left;20 reps;Seated Other Exercises Other Exercises: Standing heel raises 1 x 10 with B UE assistance    General Comments        Pertinent Vitals/Pain Pain Assessment:  0-10 Pain Score: 3  Pain Location: L knee Pain Descriptors / Indicators: Aching;Sore;Discomfort Pain Intervention(s): Limited activity within patient's tolerance;Monitored during session;Utilized  relaxation techniques;Ice applied    Home Living                      Prior Function            PT Goals (current goals can now be found in the care plan section) Acute Rehab PT Goals Patient Stated Goal: to return to PLOF PT Goal Formulation: With patient/family Time For Goal Achievement: 06/16/21 Potential to Achieve Goals: Good Progress towards PT goals: Progressing toward goals    Frequency    BID      PT Plan Current plan remains appropriate    Co-evaluation              AM-PAC PT "6 Clicks" Mobility   Outcome Measure  Help needed turning from your back to your side while in a flat bed without using bedrails?: A Little Help needed moving from lying on your back to sitting on the side of a flat bed without using bedrails?: A Little Help needed moving to and from a bed to a chair (including a wheelchair)?: A Little Help needed standing up from a chair using your arms (e.g., wheelchair or bedside chair)?: A Little Help needed to walk in hospital room?: A Lot Help needed climbing 3-5 steps with a railing? : Total 6 Click Score: 15    End of Session Equipment Utilized During Treatment: Gait belt Activity Tolerance: Patient tolerated treatment well Patient left: in bed;with call bell/phone within reach;with bed alarm set;with SCD's reapplied;Other (comment) (Bone Foam under L heel) Nurse Communication: Mobility status PT Visit Diagnosis: Unsteadiness on feet (R26.81);Muscle weakness (generalized) (M62.81);Other abnormalities of gait and mobility (R26.89)     Time: 7846-9629 PT Time Calculation (min) (ACUTE ONLY): 25 min  Charges:  $Gait Training: 8-22 mins $Therapeutic Exercise: 8-22 mins                     Verl Blalock, SPT   Verl Blalock 06/03/2021, 3:49 PM

## 2021-06-03 NOTE — TOC Initial Note (Signed)
Transition of Care [facility]) - Initial/Assessment Note    Patient Details  Name: Tony Whitney MRN: [id number] Date of Birth: 09-21-1960  Transition of Care [facility] CM/SW Contact:    Su Hilt, RN Phone Number: 06/03/2021, 2:29 PM  Clinical Narrative:              Met with the patient and his wife in the room, they are agreeable to go to SNF for STR, I explained the process of doing a bed search,  will review offers once obtained and get ins auth, he has had 2 covid vaccines and 1 booster, will get another booster prior to DC, PASSR obtained, FL2 completed, Bedsearch done           Patient Goals and CMS Choice        Expected Discharge Plan and Services                                                Prior Living Arrangements/Services                       Activities of Daily Living Home Assistive Devices/Equipment: Hearing aid, Eyeglasses ADL Screening (condition at time of admission) Patient's cognitive ability adequate to safely complete daily activities?: Yes Is the patient deaf or have difficulty hearing?: Yes Does the patient have difficulty seeing, even when wearing glasses/contacts?: No Does the patient have difficulty concentrating, remembering, or making decisions?: No Patient able to express need for assistance with ADLs?: Yes Does the patient have difficulty dressing or bathing?: No Independently performs ADLs?: Yes (appropriate for developmental age) Does the patient have difficulty walking or climbing stairs?: No Weakness of Legs: None Weakness of Arms/Hands: Left  Permission Sought/Granted                  Emotional Assessment              Admission diagnosis:  Total knee replacement status [Z96.659] Patient Active Problem List   Diagnosis Date Noted   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 06/02/2021   NSVT (nonsustained ventricular tachycardia) ([place]) 06/02/2021   Total knee replacement status 06/02/2021    Primary osteoarthritis of left knee 12/29/2020   Cardiac defibrillator in situ 02/03/2018   Cardiac sarcoidosis 16/96/7893   Chronic systolic heart failure ([place]) 07/14/2017   Nonischemic cardiomyopathy ([place]) 05/03/2017   Varicose vein of leg 04/30/2017   PCP:  Carolyne Littles, MD Pharmacy:   [id number] - [place], [place] [address] [place] [address] Phone: [phone] Fax: [phone]     Social Determinants of Health (SDOH) Interventions    Readmission Risk Interventions No flowsheet data found.

## 2021-06-03 NOTE — Progress Notes (Signed)
  Subjective: 1 Day Post-Op Procedure(s) (LRB): COMPUTER ASSISTED TOTAL KNEE ARTHROPLASTY (Left) Patient reports pain as mild.   Patient is well, and has had no acute complaints or problems Plan is to go Home after hospital stay. Negative for chest pain and shortness of breath Fever: no Gastrointestinal: Negative for nausea and vomiting  Objective: Vital signs in last 24 hours: Temp:  [97.1 F (36.2 C)-98.1 F (36.7 C)] 97.6 F (36.4 C) (09/27 0403) Pulse Rate:  [59-76] 60 (09/27 0404) Resp:  [16-22] 18 (09/27 0403) BP: (86-106)/(57-75) 95/63 (09/27 0404) SpO2:  [92 %-99 %] 92 % (09/27 0403) Weight:  [132.3 kg] 132.3 kg (09/26 0629)  Intake/Output from previous day:  Intake/Output Summary (Last 24 hours) at 06/03/2021 0625 Last data filed at 06/03/2021 0300 Gross per 24 hour  Intake 1344.81 ml  Output 1560 ml  Net -215.19 ml    Intake/Output this shift: Total I/O In: -  Out: 210 [Drains:210]  Labs: No results for input(s): HGB in the last 72 hours. No results for input(s): WBC, RBC, HCT, PLT in the last 72 hours. No results for input(s): NA, K, CL, CO2, BUN, CREATININE, GLUCOSE, CALCIUM in the last 72 hours. No results for input(s): LABPT, INR in the last 72 hours.   EXAM General - Patient is Alert and Oriented Extremity - Neurovascular intact Sensation intact distally Dorsiflexion/Plantar flexion intact Compartment soft Dressing/Incision - clean, dry, with the Hemovacs intact Motor Function - intact, moving foot and toes well on exam.  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   Weight as of this encounter: 132.3 kg. Advance diet Up with therapy D/C IV fluids  Discharge planning to go home tomorrow with home health physical therapy  DVT Prophylaxis - Lovenox, Foot Pumps, and TED hose Weight-Bearing as tolerated to left leg  Dedra Skeens, PA-C Orthopaedic Surgery 06/03/2021, 6:25 AM

## 2021-06-03 NOTE — NC FL2 (Signed)
[place] MEDICAID FL2 LEVEL OF CARE SCREENING TOOL     IDENTIFICATION  Patient Name: Tony Whitney Birthdate: 04/26/1961 Sex: male Admission Date (Current Location): 06/02/2021  [facility] and [place] Number:  Chiropodist and Address:  [facility], [address], [place], [place] [address]      Provider Number: [id number]  Attending Physician Name and Address:  Donato Heinz, MD  Relative Name and Phone Number:  Leta Jungling SPouse [phone]    Current Level of Care: Hospital Recommended Level of Care: Skilled Nursing Facility Prior Approval Number:    Date Approved/Denied:   PASRR Number: [id number]  Discharge Plan: SNF    Current Diagnoses: Patient Active Problem List   Diagnosis Date Noted   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 06/02/2021   NSVT (nonsustained ventricular tachycardia) (HCC) 06/02/2021   Total knee replacement status 06/02/2021   Primary osteoarthritis of left knee 12/29/2020   Cardiac defibrillator in situ 02/03/2018   Cardiac sarcoidosis 10/21/2017   Chronic systolic heart failure (HCC) 07/14/2017   Nonischemic cardiomyopathy (H[facility]) 05/03/2017   Varicose vein of leg 04/30/2017    Orientation RESPIRATION BLADDER Height & Weight     Self, Time, Situation, Place    Continent Weight: 132.3 kg Height:  6' (182.9 cm)  BEHAVIORAL SYMPTOMS/MOOD NEUROLOGICAL BOWEL NUTRITION STATUS      Continent    AMBULATORY STATUS COMMUNICATION OF NEEDS Skin   Extensive Assist Verbally                         Personal Care Assistance Level of Assistance  Bathing, Dressing Bathing Assistance: Limited assistance   Dressing Assistance: Limited assistance     Functional Limitations Info             SPECIAL CARE FACTORS FREQUENCY  PT (By licensed PT), OT (By licensed OT)     PT Frequency: 5 times per week OT Frequency: 5 times per week            Contractures Contractures Info: Not present     Additional Factors Info  Allergies, Code Status Code Status Info: full code Allergies Info: Gadoterate Meglumine           Current Medications (06/03/2021):  This is the current hospital active medication list Current Facility-Administered Medications  Medication Dose Route Frequency Provider Last Rate Last Admin   0.9 %  sodium chloride infusion   Intravenous Continuous Hooten, Illene Labrador, MD   Stopped at 06/02/21 1900   acetaminophen (TYLENOL) tablet 325-650 mg  325-650 mg Oral Q6H PRN Hooten, Illene Labrador, MD       alum & mag hydroxide-simeth (MAALOX/MYLANTA) 200-200-20 MG/5ML suspension 30 mL  30 mL Oral Q4H PRN Hooten, Illene Labrador, MD       bisacodyl (DULCOLAX) suppository 10 mg  10 mg Rectal Daily PRN Donato Heinz, MD   10 mg at 06/03/21 3875   celecoxib (CELEBREX) capsule 200 mg  200 mg Oral BID Donato Heinz, MD   200 mg at 06/03/21 0813   diphenhydrAMINE (BENADRYL) 12.5 MG/5ML elixir 12.5-25 mg  12.5-25 mg Oral Q4H PRN Hooten, Illene Labrador, MD       enoxaparin (LOVENOX) injection 30 mg  30 mg Subcutaneous Q12H Hooten, Illene Labrador, MD   30 mg at 06/03/21 0813   feeding supplement (ENSURE ENLIVE / ENSURE PLUS) liquid 237 mL  237 mL Oral BID BM Dedra Skeens, PA-C       ferrous sulfate  tablet 325 mg  325 mg Oral BID WC Hooten, Illene Labrador, MD   325 mg at 06/03/21 2440   folic acid (FOLVITE) tablet 1 mg  1 mg Oral Daily Hooten, Illene Labrador, MD   1 mg at 06/03/21 1027   furosemide (LASIX) tablet 20 mg  20 mg Oral QODAY Hooten, Illene Labrador, MD   20 mg at 06/02/21 1337   HYDROmorphone (DILAUDID) injection 0.5-1 mg  0.5-1 mg Intravenous Q4H PRN Hooten, Illene Labrador, MD       insulin aspart (novoLOG) injection 0-15 Units  0-15 Units Subcutaneous TID WC Hooten, Illene Labrador, MD   2 Units at 06/03/21 1204   insulin aspart (novoLOG) injection 0-5 Units  0-5 Units Subcutaneous QHS Hooten, Illene Labrador, MD       magnesium hydroxide (MILK OF MAGNESIA) suspension 30 mL  30 mL Oral Daily Hooten, Illene Labrador, MD   30 mL at 06/03/21 2536    menthol-cetylpyridinium (CEPACOL) lozenge 3 mg  1 lozenge Oral PRN Hooten, Illene Labrador, MD       Or   phenol (CHLORASEPTIC) mouth spray 1 spray  1 spray Mouth/Throat PRN Hooten, Illene Labrador, MD       Melene Muller ON 06/07/2021] methotrexate (RHEUMATREX) tablet 2.5 mg  2.5 mg Oral Once per day on Sun Sat Donato Heinz, MD       metoCLOPramide (REGLAN) tablet 10 mg  10 mg Oral TID AC & HS Hooten, Illene Labrador, MD   10 mg at 06/03/21 1204   metoprolol succinate (TOPROL-XL) 24 hr tablet 25 mg  25 mg Oral Daily Hooten, Illene Labrador, MD       multivitamin with minerals tablet 1 tablet  1 tablet Oral Daily Dedra Skeens, PA-C       ondansetron [facility]) tablet 4 mg  4 mg Oral Q6H PRN Hooten, Illene Labrador, MD       Or   ondansetron (ZOFRAN) injection 4 mg  4 mg Intravenous Q6H PRN Hooten, Illene Labrador, MD   4 mg at 06/02/21 1341   oxyCODONE (Oxy IR/ROXICODONE) immediate release tablet 10 mg  10 mg Oral Q4H PRN Donato Heinz, MD   10 mg at 06/02/21 1619   oxyCODONE (Oxy IR/ROXICODONE) immediate release tablet 5 mg  5 mg Oral Q4H PRN Donato Heinz, MD   5 mg at 06/03/21 6440   pantoprazole (PROTONIX) EC tablet 40 mg  40 mg Oral BID Donato Heinz, MD   40 mg at 06/03/21 0813   predniSONE (DELTASONE) tablet 5 mg  5 mg Oral Q breakfast Hooten, Illene Labrador, MD   5 mg at 06/03/21 3474   sacubitril-valsartan (ENTRESTO) 24-26 mg per tablet  1 tablet Oral BID Donato Heinz, MD   1 tablet at 06/03/21 2595   senna-docusate (Senokot-S) tablet 1 tablet  1 tablet Oral BID Donato Heinz, MD   1 tablet at 06/03/21 0813   sodium phosphate (FLEET) 7-19 GM/118ML enema 1 enema  1 enema Rectal Once PRN Hooten, Illene Labrador, MD       spironolactone (ALDACTONE) tablet 25 mg  25 mg Oral Daily Hooten, Illene Labrador, MD   25 mg at 06/03/21 6387   traMADol (ULTRAM) tablet 50-100 mg  50-100 mg Oral Q4H PRN Donato Heinz, MD   100 mg at 06/02/21 1348     Discharge Medications: Please see discharge summary for a list of discharge medications.  Relevant Imaging  Results:  Relevant Lab Results:   Additional Information SS#  [id number]  Barrie Dunker, RN

## 2021-06-03 NOTE — Progress Notes (Signed)
Initial Nutrition Assessment  DOCUMENTATION CODES:  Obesity unspecified  INTERVENTION:  Recommend adding Ensure Plus High Protein po BID, each supplement provides 350 kcal and 20 grams of protein.   Recommend adding MVI with minerals daily.  NUTRITION DIAGNOSIS:  Increased nutrient needs related to post-op healing as evidenced by estimated needs.  GOAL:  Patient will meet greater than or equal to 90% of their needs  MONITOR:  PO intake, Supplement acceptance, Labs, Weight trends, Skin, I & O's  REASON FOR ASSESSMENT:  Malnutrition Screening Tool    ASSESSMENT:  60 yo male with a PMH of  CHF, depression, s/p AICD device, arthritis, dilated cardiomyopathy 2/2 sarcoidosis, and NSVT who presents with knee pain. 9/26 - s/p L knee arthroplasty  Plan from surgery is for anticipated discharge tomorrow and to go home with home PT.  RD working remotely. Attempted to call patient's room phone. Pt did not answer.  Per Epic, pt ate 20% of lunch yesterday.  Per Epic, pt has lost ~4 lbs (1.3%) in the last 3 months, which is not necessarily significant for the time frame.  Recommend adding Ensure BID and MVI with minerals daily.  Medications: reviewed; ferrous sulfate BID, folic acid, Lasix every other day, SSI, Milk of Magnesia, methotrexate on Sat/Sun, Reglan TID, Protonix BID, prednisone, Senokot BID, spironolactone, NaCl @ 100 ml/hr, Dulcolax suppository PRN (given once today), oxycodone PO PRN (given once today)  Labs: reviewed; CBG 127-167 (H) HbA1c: 5.6% (03/03/2021)  NUTRITION - FOCUSED PHYSICAL EXAM: Unable to perform - defer to in-person assessment  Diet Order:   Diet Order             Diet regular Room service appropriate? Yes; Fluid consistency: Thin  Diet effective now                  EDUCATION NEEDS:  No education needs have been identified at this time  Skin:  Skin Assessment: Skin Integrity Issues: Skin Integrity Issues:: Incisions Incisions: L knee,  closed  Last BM:  06/01/21  Height:  Ht Readings from Last 1 Encounters:  06/02/21 6' (1.829 m)   Weight:  Wt Readings from Last 1 Encounters:  06/02/21 132.3 kg   BMI:  Body mass index is 39.56 kg/m.  Estimated Nutritional Needs:  Kcal:  2300-2500 Protein:  140-155 grams Fluid:  >2.1 L  Vertell Limber, RD, LDN (she/her/hers) Registered Dietitian I After-Hours/Weekend Pager # in [place]

## 2021-06-03 NOTE — Progress Notes (Signed)
Physical Therapy Treatment Patient Details Name: Tony Whitney MRN: [id number] DOB: 26-Jan-1961 Today's Date: 06/03/2021   History of Present Illness Pt is a 60 year old male who underwent elective L TKA with Dr. Ernest Pine on 06/02/21. 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 Pt is currently WBAT per orders.    PT Comments    Pt currently is post op day #1 L TKA and is agreeable to PT treatment this morning. Pt demonstrating impaired balance with ambulation with RW requiring minA for correction of balance. Deferred stair training this morning due to imbalance, genu recurvatum of L knee, and poor device management. MD and CM notified of patient status via Secure Chat. Recommending SNF placement at this time based on his risk of falls with ambulation and decreased home accessibility requiring multiple steps without railings. Will continue to update care team based on progress.     Recommendations for follow up therapy are one component of a multi-disciplinary discharge planning process, led by the attending physician.  Recommendations may be updated based on patient status, additional functional criteria and insurance authorization.  Follow Up Recommendations  SNF     Equipment Recommendations  Rolling walker with 5" wheels;3in1 (PT)    Recommendations for Other Services       Precautions / Restrictions Precautions Precautions: Fall Restrictions Weight Bearing Restrictions: Yes LLE Weight Bearing: Weight bearing as tolerated     Mobility  Bed Mobility Overal bed mobility: Needs Assistance Bed Mobility: Supine to Sit     Supine to sit: Supervision     General bed mobility comments: HOB slightly elevated. Pt able to manage B LEs off EOB and come to sitting without concerns for dizziness    Transfers Overall transfer level: Needs assistance Equipment used: Rolling walker (2 wheeled) Transfers: Sit to/from  Stand Sit to Stand: Min guard;From elevated surface         General transfer comment: Bed elevated significantly due to patient's height. Pt continues to require forward momentum to complete sit to stand. Verbal cues for 1 hand placement to push from bed surface.  Ambulation/Gait Ambulation/Gait assistance: Min assist Gait Distance (Feet): 40 Feet Assistive device: Rolling walker (2 wheeled) Gait Pattern/deviations: Step-to pattern;Decreased step length - right;Decreased stance time - left     General Gait Details: Pt demonstrating increased genu recurvatum on L LE in stance. Pt requiring verbal cueing for proper usage of RW due to tendency to pick device off the ground. Pt demonstrating difficulty maintaining balance with turning with RW requiring minA to correct balance.   Stairs     Wheelchair Mobility    Modified Rankin (Stroke Patients Only)       Balance Overall balance assessment: Needs assistance Sitting-balance support: No upper extremity supported;Feet supported Sitting balance-Leahy Scale: Good     Standing balance support: Bilateral upper extremity supported;During functional activity Standing balance-Leahy Scale: Fair Standing balance comment: Requiring heavy UE support on RW. With removal of 1 hand from RW pt demonstrating increased lateral sway requiring minA to correct balance       Cognition Arousal/Alertness: Awake/alert Behavior During Therapy: WFL for tasks assessed/performed Overall Cognitive Status: Within Functional Limits for tasks assessed    General Comments: Pt more awake and alert compared to yesterday      Exercises Total Joint Exercises Ankle Circles/Pumps: Both;10 reps;AROM;Supine Quad Sets: AROM;Strengthening;Left;20 reps;Supine Gluteal Sets: AROM;Strengthening;Both;10 reps;Supine Heel Slides: AROM;Strengthening;Left;15 reps;Supine Goniometric ROM: 18-90 degrees Marching in Standing: AROM;Strengthening;Both;10 reps;Standing     General  Comments        Pertinent Vitals/Pain Pain Assessment: 0-10 Pain Score: 3  Pain Location: L knee Pain Descriptors / Indicators: Aching;Sore;Discomfort Pain Intervention(s): Limited activity within patient's tolerance;Monitored during session;Premedicated before session;Relaxation;Ice applied    Home Living                      Prior Function            PT Goals (current goals can now be found in the care plan section) Acute Rehab PT Goals Patient Stated Goal: to return to PLOF PT Goal Formulation: With patient/family Time For Goal Achievement: 06/16/21 Potential to Achieve Goals: Good Progress towards PT goals: PT to reassess next treatment    Frequency    BID      PT Plan Discharge plan needs to be updated    Co-evaluation              AM-PAC PT "6 Clicks" Mobility   Outcome Measure  Help needed turning from your back to your side while in a flat bed without using bedrails?: A Little Help needed moving from lying on your back to sitting on the side of a flat bed without using bedrails?: A Little Help needed moving to and from a bed to a chair (including a wheelchair)?: A Little Help needed standing up from a chair using your arms (e.g., wheelchair or bedside chair)?: A Little Help needed to walk in hospital room?: A Lot Help needed climbing 3-5 steps with a railing? : Total 6 Click Score: 15    End of Session Equipment Utilized During Treatment: Gait belt Activity Tolerance: Patient tolerated treatment well Patient left: in chair;with chair alarm set;with SCD's reapplied;with call bell/phone within reach Nurse Communication: Mobility status PT Visit Diagnosis: Unsteadiness on feet (R26.81);Muscle weakness (generalized) (M62.81);Other abnormalities of gait and mobility (R26.89)     Time: 3244-0102 PT Time Calculation (min) (ACUTE ONLY): 29 min  Charges:  $Gait Training: 8-22 mins $Therapeutic Exercise: 8-22 mins                      Verl Blalock, SPT    Verl Blalock 06/03/2021, 1:54 PM

## 2021-06-04 DIAGNOSIS — M1712 Unilateral primary osteoarthritis, left knee: Secondary | ICD-10-CM | POA: Diagnosis not present

## 2021-06-04 LAB — GLUCOSE, CAPILLARY
Glucose-Capillary: 111 mg/dL — ABNORMAL HIGH (ref 70–99)
Glucose-Capillary: 86 mg/dL (ref 70–99)

## 2021-06-04 NOTE — Progress Notes (Signed)
Physical Therapy Treatment Patient Details Name: Tony Whitney MRN: [id number] DOB: [birth year] Today's Date: 06/04/2021   History of Present Illness Pt is a 60 year old male who underwent elective L TKA with Dr. Ernest Pine on 06/02/21. 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 Pt is currently WBAT per orders.    PT Comments    Pt has made great improvements towards his therapy goals. Pt able to ambulate 100 ft in hallway with RW + CGA for safety with improved step through pattern. Pt completed 4 steps with RW with minA for steadying of RW. Pt educated on home exercise program and given handout. Pt reported no dizziness and no loss of balance during mobility related activities. Based on patient's body habitus, pt will need a bariatric 3 in 1 bedside commode. Based on patient's improvements, updated recommendations to home with HHPT with [facility] with mobility from spouse. Pt will benefit from skilled PT services to improve functional mobility and return to PLOF.   Recommendations for follow up therapy are one component of a multi-disciplinary discharge planning process, led by the attending physician.  Recommendations may be updated based on patient status, additional functional criteria and insurance authorization.  Follow Up Recommendations  Home health PT;Supervision for mobility/OOB     Equipment Recommendations  Rolling walker with 5" wheels;3in1 (PT)    Recommendations for Other Services       Precautions / Restrictions Precautions Precautions: Fall Restrictions Weight Bearing Restrictions: Yes LLE Weight Bearing: Weight bearing as tolerated     Mobility  Bed Mobility Overal bed mobility: Needs Assistance Bed Mobility: Supine to Sit;Sit to Supine     Supine to sit: Supervision Sit to supine: Supervision   General bed mobility comments: Usage of momentum for boty habitus    Transfers Overall transfer level: Needs  assistance Equipment used: Rolling walker (2 wheeled) Transfers: Sit to/from Stand Sit to Stand: Min guard         General transfer comment: Pt able to complete sit to stand transfer with CGA for steadying RW. Pt able to stand from lower bed surface with CGA.  Ambulation/Gait Ambulation/Gait assistance: Min guard Gait Distance (Feet): 120 Feet Assistive device: Rolling walker (2 wheeled) Gait Pattern/deviations: Step-through pattern;Decreased stride length     General Gait Details: Significant improvement in patient's gait speed and stride length with strep through pattern. Pt had one instance of L knee hyperextension and able to maintain balance with usage of RW   Stairs Stairs: Yes Stairs assistance: Min assist Stair Management: Step to pattern;Backwards;With walker Number of Stairs: 4 General stair comments: Ascended stairs backwards with usage of RW + minA for steadying RW, descended stairs forwards and minA for steadying of RW. Cues throughout for sequencing of B LEs, RW, and hand placement. No evidence of knee buckling during stair training   Wheelchair Mobility    Modified Rankin (Stroke Patients Only)       Balance Overall balance assessment: Needs assistance   Sitting balance-Leahy Scale: Normal     Standing balance support: Single extremity supported;During functional activity Standing balance-Leahy Scale: Fair Standing balance comment: Able to don mask with 1 UE assist without LOB noted                            Cognition Arousal/Alertness: Awake/alert Behavior During Therapy: WFL for tasks assessed/performed Overall Cognitive Status: Within Functional Limits for tasks assessed  General Comments: Pt is A&O, pleasant, appropriate and participatory throughout      Exercises Total Joint Exercises Heel Slides: AROM;Left;Other reps (comment);Seated (3 x 15) Long Arc Quad: AROM;Left;Other reps  (comment);Seated (3 x 15) Goniometric ROM: 12- 96 Other Exercises Other Exercises: Pt educated on HEP for general knee AROM and strengthening and given handout. Pt given written instructions for proper guarding for spouse during stair training. All questions answered at this time.    General Comments        Pertinent Vitals/Pain Pain Assessment: 0-10 Pain Score: 3  Pain Location: L knee Pain Descriptors / Indicators: Aching;Sore Pain Intervention(s): Limited activity within patient's tolerance;Monitored during session;Premedicated before session    Home Living                      Prior Function            PT Goals (current goals can now be found in the care plan section) Acute Rehab PT Goals Patient Stated Goal: to return to PLOF PT Goal Formulation: With patient/family Time For Goal Achievement: 06/16/21 Potential to Achieve Goals: Good Progress towards PT goals: Progressing toward goals    Frequency    BID      PT Plan Discharge plan needs to be updated    Co-evaluation              AM-PAC PT "6 Clicks" Mobility   Outcome Measure  Help needed turning from your back to your side while in a flat bed without using bedrails?: None Help needed moving from lying on your back to sitting on the side of a flat bed without using bedrails?: None Help needed moving to and from a bed to a chair (including a wheelchair)?: A Little Help needed standing up from a chair using your arms (e.g., wheelchair or bedside chair)?: A Little Help needed to walk in hospital room?: A Little Help needed climbing 3-5 steps with a railing? : A Lot 6 Click Score: 19    End of Session Equipment Utilized During Treatment: Gait belt Activity Tolerance: Patient tolerated treatment well Patient left: in bed;with call bell/phone within reach;with bed alarm set;with SCD's reapplied Nurse Communication: Mobility status PT Visit Diagnosis: Unsteadiness on feet (R26.81);Muscle  weakness (generalized) (M62.81);Other abnormalities of gait and mobility (R26.89)     Time: 0277-4128 PT Time Calculation (min) (ACUTE ONLY): 38 min  Charges:                        Verl Blalock, SPT    Verl Blalock 06/04/2021, 11:32 AM

## 2021-06-04 NOTE — Progress Notes (Signed)
  Subjective: 2 Days Post-Op Procedure(s) (LRB): COMPUTER ASSISTED TOTAL KNEE ARTHROPLASTY (Left) Patient reports pain as mild.   Patient is well, and has had no acute complaints or problems Plan is to go Home versus rehab after hospital stay. Negative for chest pain and shortness of breath Fever: no Gastrointestinal: Negative for nausea and vomiting  Objective: Vital signs in last 24 hours: Temp:  [97.5 F (36.4 C)-98.2 F (36.8 C)] 97.7 F (36.5 C) (09/28 0349) Pulse Rate:  [59-69] 67 (09/28 0349) Resp:  [16-18] 16 (09/28 0349) BP: (91-109)/(64-77) 101/72 (09/28 0349) SpO2:  [95 %-100 %] 99 % (09/28 0349)  Intake/Output from previous day:  Intake/Output Summary (Last 24 hours) at 06/04/2021 0726 Last data filed at 06/04/2021 0549 Gross per 24 hour  Intake 742.81 ml  Output 700 ml  Net 42.81 ml    Intake/Output this shift: No intake/output data recorded.  Labs: No results for input(s): HGB in the last 72 hours. No results for input(s): WBC, RBC, HCT, PLT in the last 72 hours. No results for input(s): NA, K, CL, CO2, BUN, CREATININE, GLUCOSE, CALCIUM in the last 72 hours. No results for input(s): LABPT, INR in the last 72 hours.   EXAM General - Patient is Alert and Oriented Extremity - Neurovascular intact Sensation intact distally Dorsiflexion/Plantar flexion intact Compartment soft Dressing/Incision - clean, dry, with the Hemovacs removed with no complication.  The Hemovac with tubing was intact on removal. Motor Function - intact, moving foot and toes well on exam.  Able to do a straight leg raise independently.  Ambulated 50 feet with physical therapy.  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   Weight as of this encounter: 132.3 kg. Advance diet Up with therapy D/C IV fluids  Discharge planning to go home today with home health physical therapy versus rehab.  Depending on his physical therapy effort.  DVT Prophylaxis - Lovenox, Foot Pumps, and TED hose Weight-Bearing as tolerated to left leg  Dedra Skeens, PA-C Orthopaedic Surgery 06/04/2021, 7:26 AM

## 2021-06-04 NOTE — Progress Notes (Signed)
Occupational Therapy Treatment Patient Details Name: Tony Whitney MRN: [id number] DOB: 22-Oct-1960 Today's Date: 06/04/2021   History of present illness Pt is a 60 year old male who underwent elective L TKA with Dr. Ernest Pine on 06/02/21. 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 Pt is currently WBAT per orders.   OT comments  Pt seen for OT Tx this date to f/u re: safety with ADLs/ADL mobility. Pt reports he feels he is doing much better with the exception of pretty significant pain. Does report some relief with a break from bone foam during OT session. In addition, RN prepping to bring pain medication. Pt able to perform sup<>Sit with SUPV only, demos good control and pace. Pt demos normal static sitting balance and good dynamic sitting balance. Pt demos ability to perform seated LB ADLs with increased time and CGA for very minimal initiation of task. Pt able to manage polar care with minimal cueing and verbalizes good understanding of compression stocking mgt. Pt generally with good carryover of OT education. Pt wishing to return home with family assistance. As he has progressed with ADLs, from OT standpoint, this is appropriate. D/c recommendation updated to reflect. Still anticipate that f/u HHOT would be beneficial to ensure safety with ADLs in the natural environment.   Recommendations for follow up therapy are one component of a multi-disciplinary discharge planning process, led by the attending physician.  Recommendations may be updated based on patient status, additional functional criteria and insurance authorization.    Follow Up Recommendations  Home health OT;Supervision/Assistance - 24 hour    Equipment Recommendations  3 in 1 bedside commode;Tub/shower seat;Other (comment) (2ww)    Recommendations for Other Services      Precautions / Restrictions Precautions Precautions: Fall Restrictions Weight Bearing  Restrictions: Yes LLE Weight Bearing: Weight bearing as tolerated       Mobility Bed Mobility Overal bed mobility: Needs Assistance Bed Mobility: Supine to Sit;Sit to Supine     Supine to sit: Supervision Sit to supine: Supervision   General bed mobility comments: increased time    Transfers                 General transfer comment: deferred    Balance Overall balance assessment: Needs assistance   Sitting balance-Leahy Scale: Good                                     ADL either performed or assessed with clinical judgement   ADL Overall ADL's : Needs assistance/impaired                     Lower Body Dressing: Min guard;Sitting/lateral leans Lower Body Dressing Details (indicate cue type and reason): increased time, ed re: modified technique                     Vision Patient Visual Report: No change from baseline     Perception     Praxis      Cognition Arousal/Alertness: Awake/alert Behavior During Therapy: WFL for tasks assessed/performed Overall Cognitive Status: Within Functional Limits for tasks assessed                                 General Comments: Pt is A&O, pleasant, appropriate and participatory throughout  Exercises Other Exercises Other Exercises: OT ed with pt re: compression stocking mgt, polar care mgt, LB dressing modified techniques, positioning at home (importance of extension as tolerable). Pt with good understanding.   Shoulder Instructions       General Comments      Pertinent Vitals/ Pain       Pain Assessment: 0-10 Pain Score: 7  Pain Location: L knee Pain Descriptors / Indicators: Aching;Sore;Discomfort Pain Intervention(s): Limited activity within patient's tolerance;Monitored during session;Patient requesting pain meds-RN notified;Repositioned  Home Living                                          Prior Functioning/Environment               Frequency  Min 1X/week        Progress Toward Goals  OT Goals(current goals can now be found in the care plan section)  Progress towards OT goals: Progressing toward goals  Acute Rehab OT Goals Patient Stated Goal: to return to PLOF OT Goal Formulation: With patient Time For Goal Achievement: 06/16/21 Potential to Achieve Goals: Good  Plan Frequency remains appropriate;Discharge plan needs to be updated    Co-evaluation                 AM-PAC OT "6 Clicks" Daily Activity     Outcome Measure   Help from another person eating meals?: None Help from another person taking care of personal grooming?: None Help from another person toileting, which includes using toliet, bedpan, or urinal?: A Little Help from another person bathing (including washing, rinsing, drying)?: A Little Help from another person to put on and taking off regular upper body clothing?: None Help from another person to put on and taking off regular lower body clothing?: A Little 6 Click Score: 21    End of Session    OT Visit Diagnosis: Pain;Other abnormalities of gait and mobility (R26.89) Pain - Right/Left: Left Pain - part of body: Knee   Activity Tolerance Patient tolerated treatment well   Patient Left in bed;with call bell/phone within reach   Nurse Communication Mobility status;Patient requests pain meds        Time: 0910-0933 OT Time Calculation (min): 23 min  Charges: OT General Charges $OT Visit: 1 Visit OT Treatments $Self Care/Home Management : 8-22 mins $Therapeutic Activity: 8-22 mins  Rejeana Brock, MS, OTR/L ascom [phone] 06/04/21, 9:47 AM

## 2021-06-04 NOTE — Progress Notes (Signed)
DISCHARGE NOTE:  Pt given discharge instructions ad scripts. Pt verbalized understanding. TED hose on both legs. Extra honeycomb sent with pt. Surgical dressing clean dry intact. Pt wheeled to car by staff. [employer] providing transportation.

## 2021-06-04 NOTE — Discharge Summary (Signed)
Physician Discharge Summary  Subjective: 2 Days Post-Op Procedure(s) (LRB): COMPUTER ASSISTED TOTAL KNEE ARTHROPLASTY (Left) Patient reports pain as mild.   Patient seen in rounds with Dr. Ernest Pine. Patient is well, and has had no acute complaints or problems Patient is ready to go home with home health physical therapy  Physician Discharge Summary  Patient ID: Tony Whitney MRN: [id number] DOB/AGE: 1961-05-28 60 y.o.  Admit date: 06/02/2021 Discharge date: 06/04/2021  Admission Diagnoses:  Discharge Diagnoses:  Active Problems:   Total knee replacement status   Discharged Condition: fair  Hospital Course: The patient is postop day 2 and doing quite well.  His pain level is improving.  He ambulated 50 feet with physical therapy.  The patient would like to go home with home health physical therapy.  His vitals have remained more stable with mild hypotension.  Treatments: surgery:  Left total knee arthroplasty using computer-assisted navigation   SURGEON:  Jena Gauss. M.D.   ANESTHESIA: general   ESTIMATED BLOOD LOSS: 50 mL   FLUIDS REPLACED: 500 mL of crystalloid   TOURNIQUET TIME: 91 minutes   DRAINS: 2 medium Hemovac drains   SOFT TISSUE RELEASES: Anterior cruciate ligament, posterior cruciate ligament, deep  medial collateral ligament, patellofemoral ligament   IMPLANTS UTILIZED: DePuy Attune size 7 posterior stabilized femoral component (cemented), size 9 rotating platform tibial component (cemented), 41 mm medialized dome patella (cemented), and a 5 mm stabilized rotating platform polyethylene insert.  Discharge Exam: Blood pressure 101/72, pulse 67, temperature 97.7 F (36.5 C), resp. rate 16, height 6' (1.829 m), weight 132.3 kg, SpO2 99 %.   Disposition:    Allergies as of 06/04/2021       Reactions   Gadoterate Meglumine Nausea And Vomiting        Medication List     STOP taking these medications    meloxicam 7.5 MG tablet Commonly known  as: MOBIC       TAKE these medications    celecoxib 200 MG capsule Commonly known as: CELEBREX Take 1 capsule (200 mg total) by mouth 2 (two) times daily.   enoxaparin 40 MG/0.4ML injection Commonly known as: LOVENOX Inject 0.4 mLs (40 mg total) into the skin daily for 14 days.   Entresto 49-51 MG Generic drug: sacubitril-valsartan Take 1 tablet by mouth 2 (two) times daily.   Entresto 24-26 MG Generic drug: sacubitril-valsartan Take 1 tablet by mouth 2 (two) times daily.   escitalopram 20 MG tablet Commonly known as: LEXAPRO Take 1 tablet by mouth daily.   folic acid 1 MG tablet Commonly known as: FOLVITE Take 1 mg by mouth daily. In the morning   furosemide 20 MG tablet Commonly known as: LASIX Take 20 mg by mouth every other day. As needed for swelling   Jardiance 10 MG Tabs tablet Generic drug: empagliflozin Take 10 mg by mouth every morning.   methotrexate 2.5 MG tablet Commonly known as: RHEUMATREX Take 2.5 mg by mouth once a week. Caution:Chemotherapy. Protect from light. Takes 2.5 mg every Saturday and Sunday (Cardiac sarcoidosis)   metoprolol succinate 25 MG 24 hr tablet Commonly known as: TOPROL-XL Take 25 mg by mouth daily. In the morning   oxyCODONE 5 MG immediate release tablet Commonly known as: Oxy IR/ROXICODONE Take 1 tablet (5 mg total) by mouth every 4 (four) hours as needed for moderate pain (pain score 4-6).   predniSONE 5 MG tablet Commonly known as: DELTASONE Take 5 mg by mouth daily with breakfast.   spironolactone 25  MG tablet Commonly known as: ALDACTONE Take 25 mg by mouth daily.   traMADol 50 MG tablet Commonly known as: ULTRAM Take 1-2 tablets (50-100 mg total) by mouth every 4 (four) hours as needed for moderate pain.               Durable Medical Equipment  (From admission, onward)           Start     Ordered   06/02/21 1049  DME Walker rolling  Once       Question:  Patient needs a walker to treat with the  following condition  Answer:  Total knee replacement status   06/02/21 1048   06/02/21 1049  DME Bedside commode  Once       Question:  Patient needs a bedside commode to treat with the following condition  Answer:  Total knee replacement status   06/02/21 1048            Follow-up Information     Madelyn Flavors, PA-C Follow up on 06/17/2021.   Specialty: Orthopedic Surgery Why: at 1:15pm Contact information: [address] [facility] [place] [address] [phone]         Donato Heinz, MD Follow up on 07/22/2021.   Specialty: Orthopedic Surgery Why: at 3:00pm Contact information: [address] [facility] [place] [address] [phone]                 Signed: Lenard Forth, Suraya Vidrine 06/04/2021, 7:30 AM   Objective: Vital signs in last 24 hours: Temp:  [97.5 F (36.4 C)-98.2 F (36.8 C)] 97.7 F (36.5 C) (09/28 0349) Pulse Rate:  [59-69] 67 (09/28 0349) Resp:  [16-18] 16 (09/28 0349) BP: (91-109)/(64-77) 101/72 (09/28 0349) SpO2:  [95 %-100 %] 99 % (09/28 0349)  Intake/Output from previous day:  Intake/Output Summary (Last 24 hours) at 06/04/2021 0730 Last data filed at 06/04/2021 0549 Gross per 24 hour  Intake 742.81 ml  Output 700 ml  Net 42.81 ml    Intake/Output this shift: No intake/output data recorded.  Labs: No results for input(s): HGB in the last 72 hours. No results for input(s): WBC, RBC, HCT, PLT in the last 72 hours. No results for input(s): NA, K, CL, CO2, BUN, CREATININE, GLUCOSE, CALCIUM in the last 72 hours. No results for input(s): LABPT, INR in the last 72 hours.  EXAM: General - Patient is Alert and Oriented Extremity - Neurovascular intact Sensation intact distally Compartment soft Incision - clean, dry, with Hemovac removed with no complication. Motor Function -plantarflexion and dorsiflexion are intact.  Able to do a straight leg raise  independently.  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   Weight as of this encounter: 132.3 kg. Advance diet  Up with therapy Discharge home with home health Diet - Regular diet Follow up - in 2 weeks Activity - WBAT Disposition - Home Condition Upon Discharge - Stable DVT Prophylaxis - Lovenox  Dedra Skeens, PA-C Orthopaedic Surgery 06/04/2021, 7:30 AM

## 2021-06-04 NOTE — Progress Notes (Signed)
PT Cancellation Note  Patient Details Name: Tony Whitney MRN: [id number] DOB: [birth year]   Cancelled Treatment:     Due to body habitus, will need bariatric 3 in 1. Regular size will not work. Pt has practiced with bariatric size and pt comfortable. Updated TOC on recommendations.   Tony Whitney 06/04/2021, 11:13 AM Elizabeth Palau, PT, DPT [phone]

## 2021-06-04 NOTE — TOC Progression Note (Addendum)
Transition of Care [facility]) - Progression Note    Patient Details  Name: Tony Whitney MRN: [id number] Date of Birth: 09/05/1961  [facility]) CM/SW Contact  Barrie Dunker, RN Phone Number: 06/04/2021, 8:45 AM  Clinical Narrative:   The patient is doing better and has decided to go home with Home health, he is set up with [facility] for [facility] services, he lives with his wife and has support at home, he has transportation and can afford his medications He needs  a RW and a 3 in 1 bariatric due to body [employer], [employer] to deliver to the room         Expected Discharge Plan and Services           Expected Discharge Date: 06/04/21                                     Social Determinants of Health (SDOH) Interventions    Readmission Risk Interventions No flowsheet data found.

## 2021-06-06 NOTE — TOC Transition Note (Signed)
Transition of Care [facility]) - CM/SW Discharge Note   Patient Details  Name: Tony Whitney MRN: [id number] Date of Birth: 1961-09-06  Transition of Care [facility]) CM/SW Contact:  Hetty Ely, RN Phone Number: 06/06/2021, 5:24 PM   Clinical Narrative:  Patient wife called to inquire about delivery of [facility], says she was informed that it would be delivered to the home. Reviewed chart, [employer] was the agency, so I text Supervisor to call patient and follow up. Wife was informed that she will be called maybe tomorrow about [facility] delivery.     Final next level of care: Home w Home Health Services Barriers to Discharge: Barriers Resolved   Patient Goals and CMS Choice        Discharge Placement                       Discharge Plan and Services   Discharge Planning Services: CM Consult            DME Arranged: Dan Humphreys rolling, 3-N-1 DME Agency: [employer] Date DME Agency Contacted: 06/04/21 Time DME Agency Contacted: [phone] Representative spoke with at DME Agency: Bjorn Loser HH Arranged: PT HH Agency: [facility] Date [facility] Agency Contacted: 06/04/21 Time HH Agency Contacted: 0848    Social Determinants of Health (SDOH) Interventions     Readmission Risk Interventions No flowsheet data found.

## 2022-10-25 IMAGING — DX DG KNEE 1-2V PORT*L*
2 series · 2 of 2 positions shown · non-contrast
Comparison: None.

CLINICAL DATA: Postop right total knee arthroplasty.

EXAM:
PORTABLE LEFT KNEE - 1-2 VIEW

[knee ap]
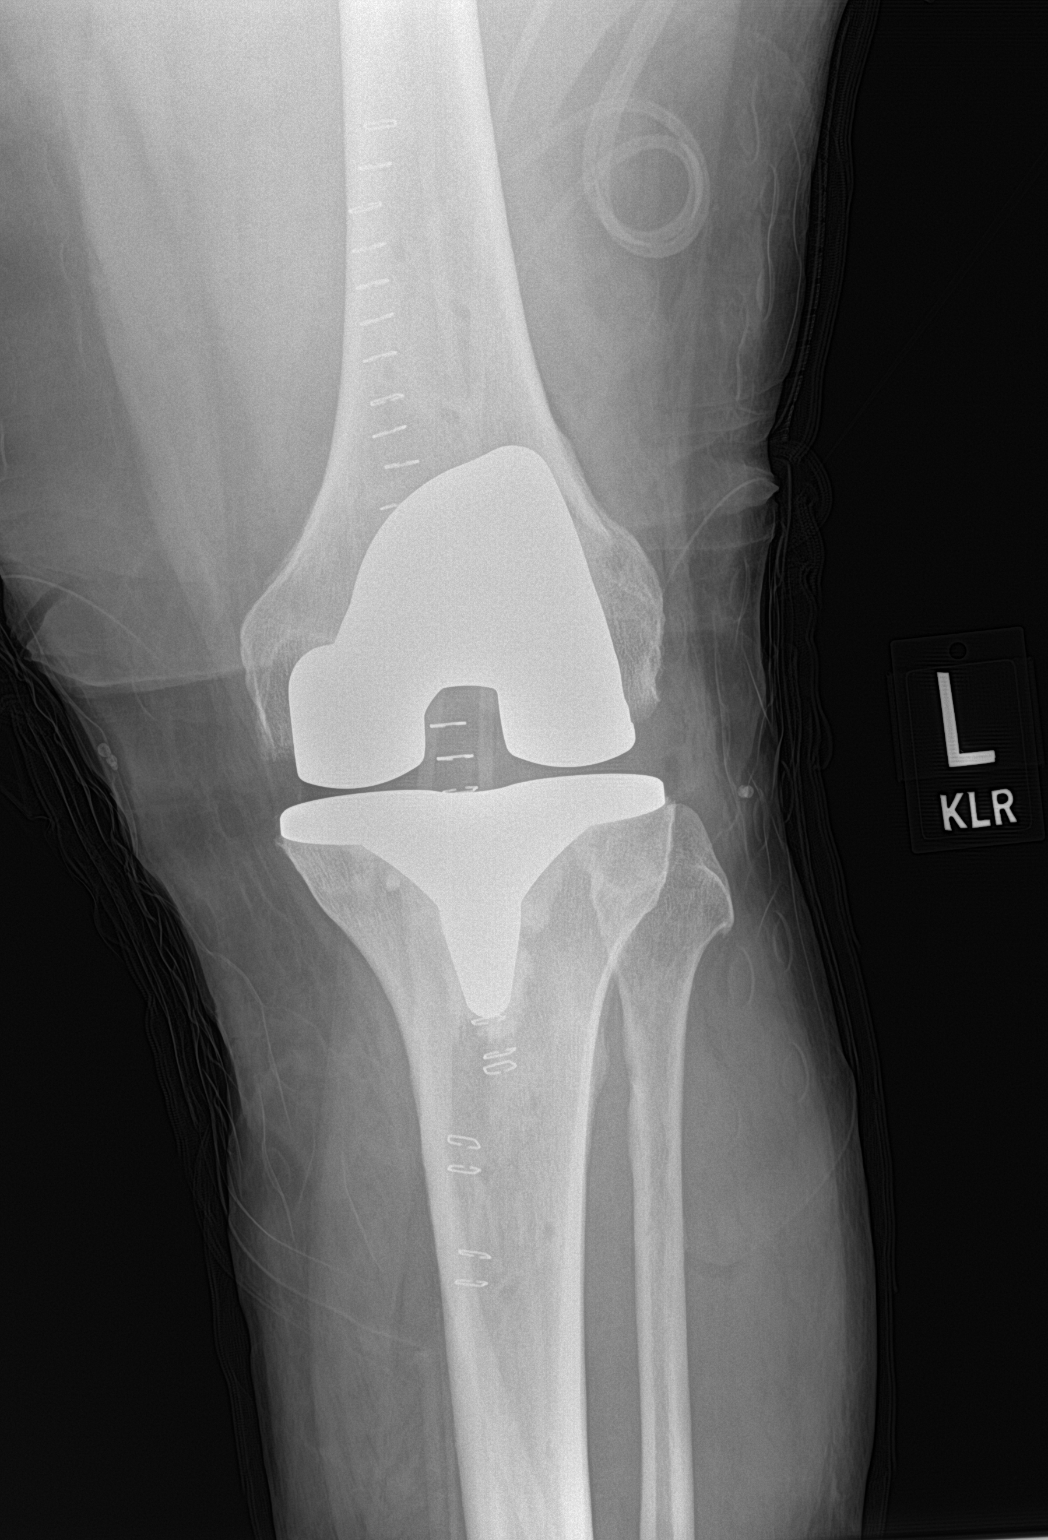

[knee lat]
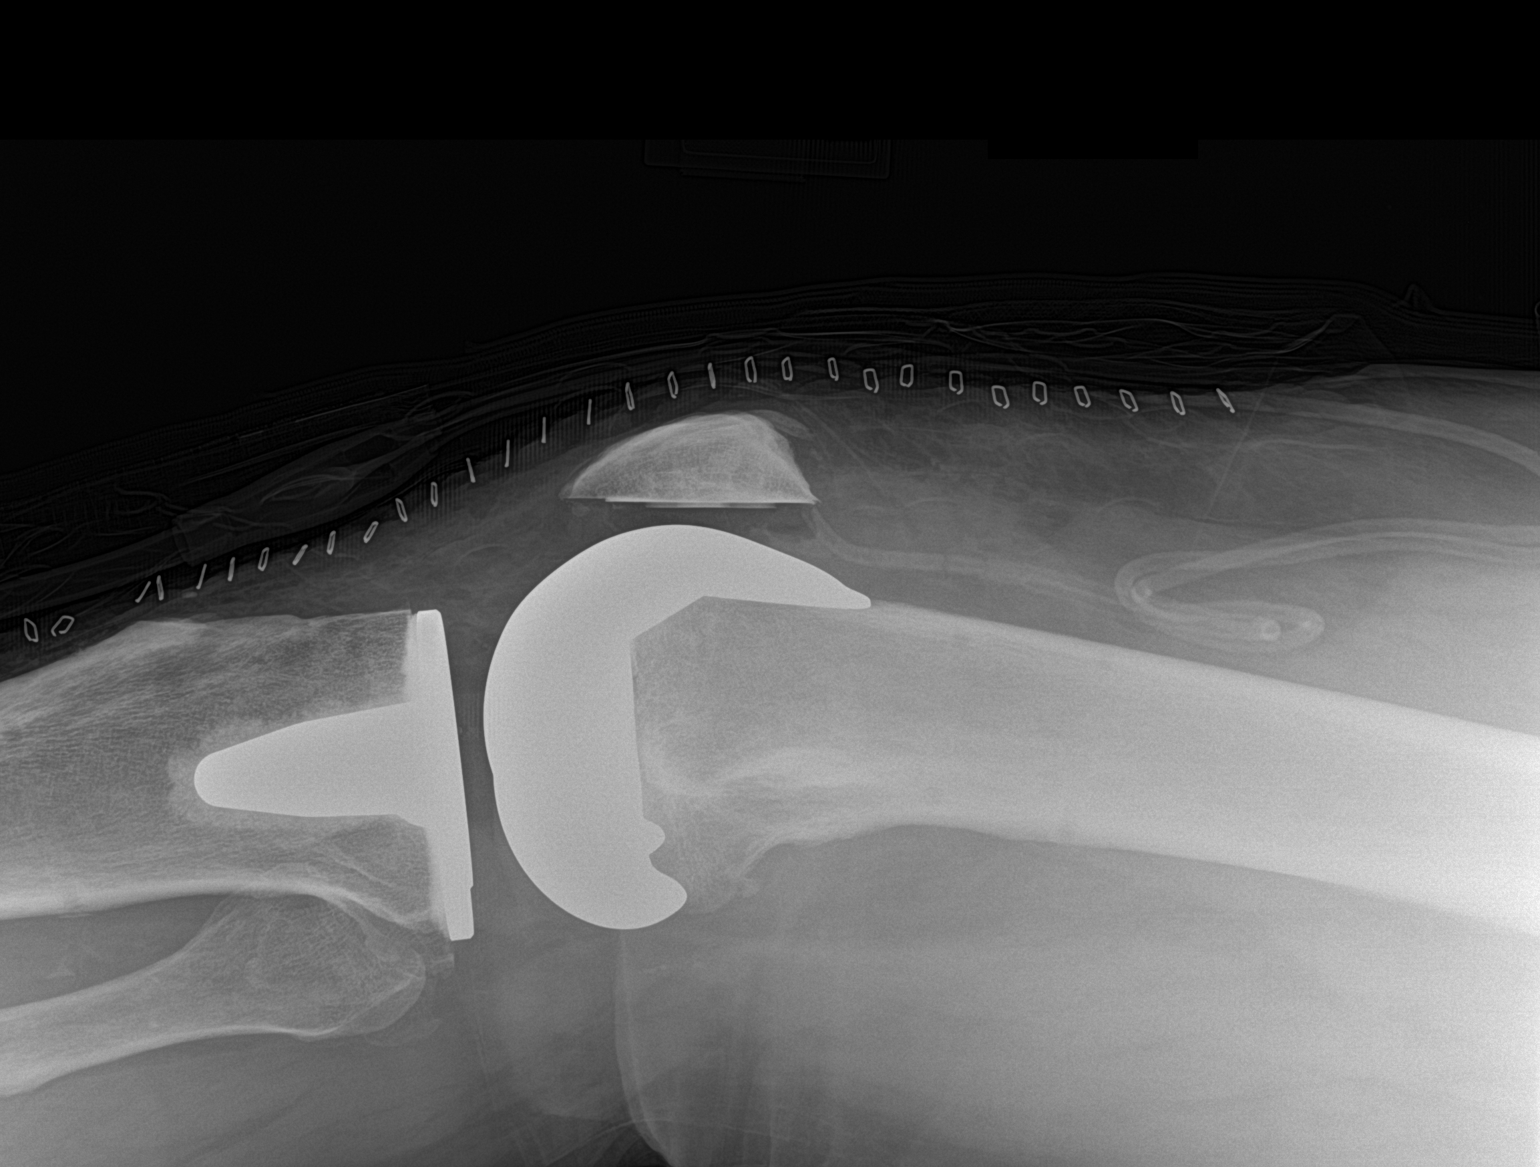

[2 of 2 positions shown; findings below may reference images not displayed]

FINDINGS: Postoperative change from left total knee arthroplasty identified.
The hardware components are in anatomic alignment. No fracture.
Surgical drain is identified with tip terminating in the
suprapatellar joint space. Midline anterior skin staples noted.
IMPRESSION: Status post left total knee arthroplasty.

## 2024-09-25 ENCOUNTER — Encounter: Attending: Cardiology

## 2024-09-25 ENCOUNTER — Other Ambulatory Visit: Payer: Self-pay

## 2024-09-25 DIAGNOSIS — I5042 Chronic combined systolic (congestive) and diastolic (congestive) heart failure: Secondary | ICD-10-CM

## 2024-09-25 DIAGNOSIS — Z95811 Presence of heart assist device: Secondary | ICD-10-CM

## 2024-09-25 NOTE — Progress Notes (Signed)
 Virtual Visit completed. Patient informed on EP and RD appointment and 6 Minute walk test. Patient also informed of patient health questionnaires on My Chart. Patient Verbalizes understanding. Visit diagnosis can be found in [facility] 09/12/2024.

## 2024-09-27 ENCOUNTER — Encounter
